# Patient Record
Sex: Male | Born: 1960 | Race: White | Hispanic: No | Marital: Married | Smoking: Former smoker
Health system: Southern US, Community
[De-identification: ages and names within clinical notes are randomized; demographics above are authoritative.]

## PROBLEM LIST (undated history)

## (undated) HISTORY — PX: ANKLE SURGERY: SHX546

## (undated) HISTORY — PX: KIDNEY STONE SURGERY: SHX686

---

## 2001-03-03 HISTORY — PX: TOTAL KNEE ARTHROPLASTY: SHX125

## 2008-02-08 ENCOUNTER — Ambulatory Visit (HOSPITAL_COMMUNITY): Admission: RE | Admit: 2008-02-08 | Discharge: 2008-02-08 | Payer: Self-pay | Admitting: Orthopedic Surgery

## 2009-01-01 ENCOUNTER — Inpatient Hospital Stay (HOSPITAL_COMMUNITY): Admission: RE | Admit: 2009-01-01 | Discharge: 2009-01-04 | Payer: Self-pay | Admitting: Specialist

## 2009-01-07 ENCOUNTER — Inpatient Hospital Stay (HOSPITAL_COMMUNITY): Admission: EM | Admit: 2009-01-07 | Discharge: 2009-01-09 | Payer: Self-pay | Admitting: Emergency Medicine

## 2009-01-08 ENCOUNTER — Ambulatory Visit: Payer: Self-pay | Admitting: Vascular Surgery

## 2009-01-08 ENCOUNTER — Encounter (INDEPENDENT_AMBULATORY_CARE_PROVIDER_SITE_OTHER): Payer: Self-pay | Admitting: Specialist

## 2009-04-03 ENCOUNTER — Ambulatory Visit (HOSPITAL_BASED_OUTPATIENT_CLINIC_OR_DEPARTMENT_OTHER): Admission: RE | Admit: 2009-04-03 | Discharge: 2009-04-03 | Payer: Self-pay | Admitting: Specialist

## 2009-12-14 ENCOUNTER — Encounter: Admission: RE | Admit: 2009-12-14 | Discharge: 2009-12-14 | Payer: Self-pay | Admitting: Orthopedic Surgery

## 2010-04-04 IMAGING — CR DG KNEE 1-2V*L*
2 series · 2 of 2 positions shown · non-contrast
Comparison: None

CLINICAL DATA: Preop for left total knee

LEFT KNEE - 1-2 VIEW

[w knee ap left *]
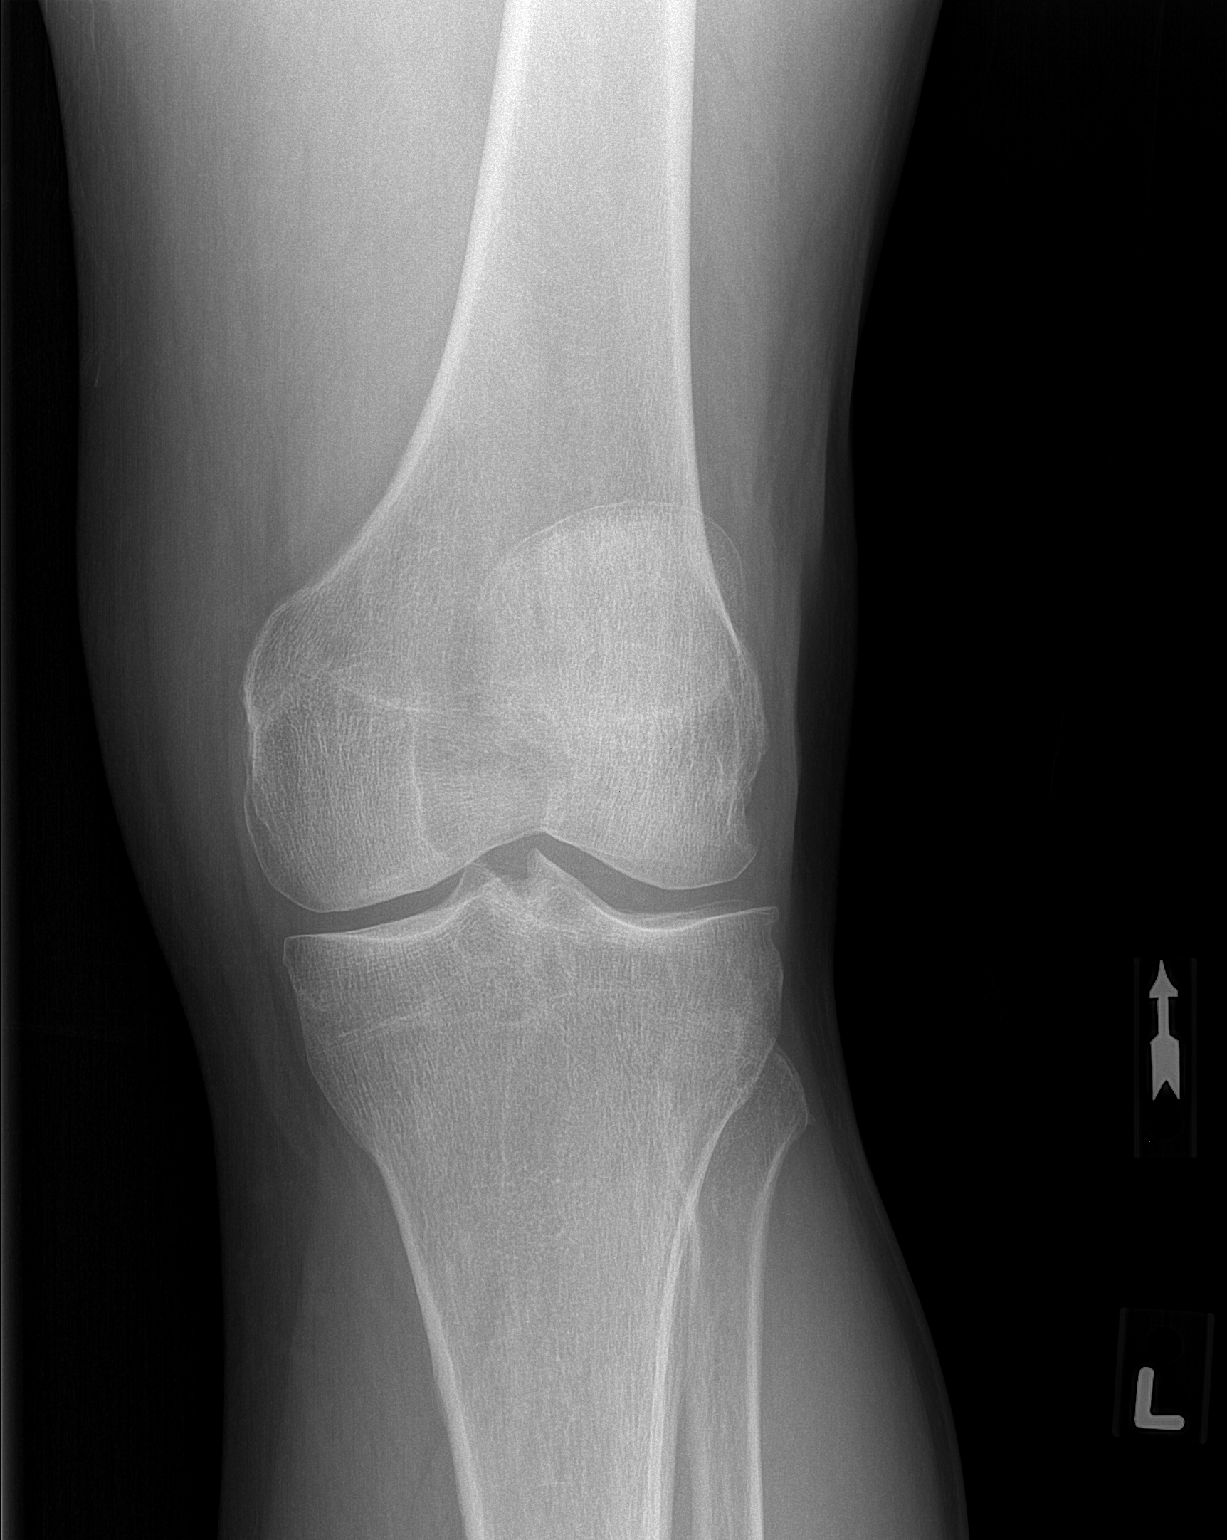

[w knee lat. left]
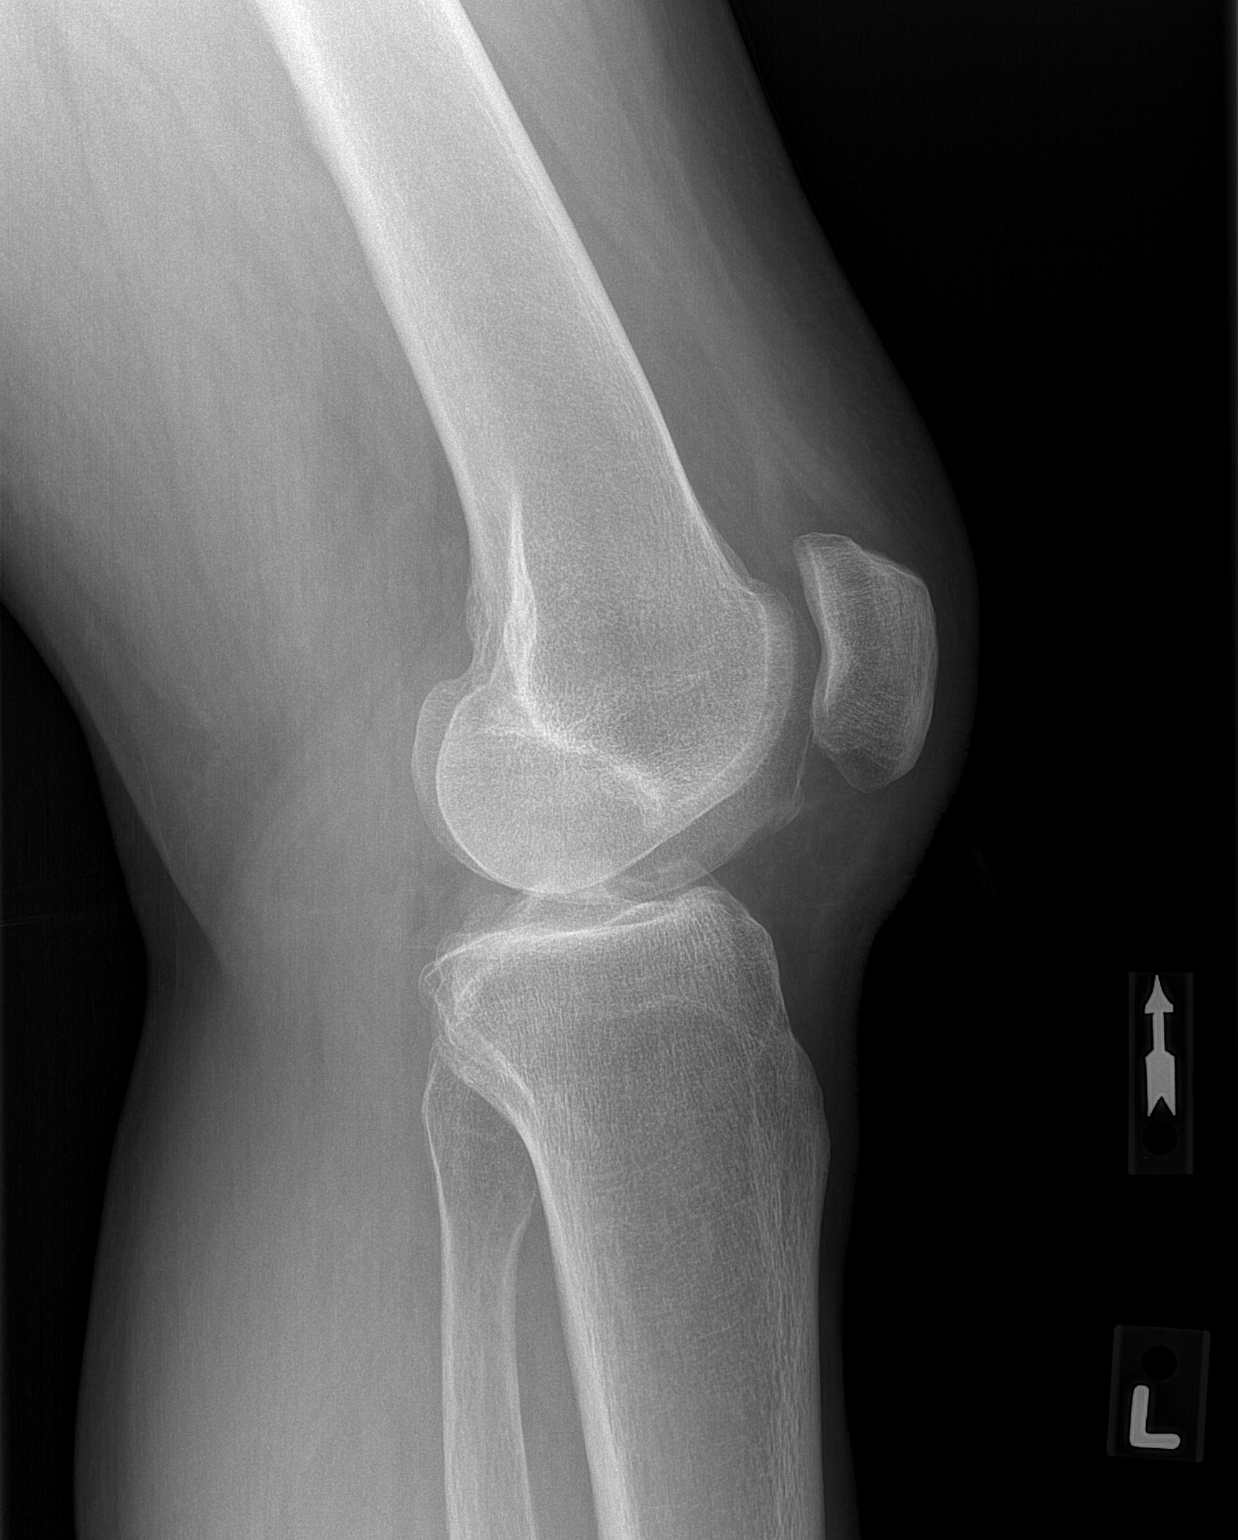

[2 of 2 positions shown; findings below may reference images not displayed]

FINDINGS: There is mild narrowing of the medial and lateral
compartment joint spaces.  There is mild osteophytosis of the
lateral compartment.  No patellar spurring.  No joint effusion.
IMPRESSION: Mild osteoarthritis.

## 2010-05-22 LAB — POCT I-STAT 4, (NA,K, GLUC, HGB,HCT)
Glucose, Bld: 97 mg/dL (ref 70–99)
HCT: 49 % (ref 39.0–52.0)
Hemoglobin: 16.7 g/dL (ref 13.0–17.0)
Potassium: 4 mEq/L (ref 3.5–5.1)
Sodium: 142 mEq/L (ref 135–145)

## 2010-06-05 LAB — CBC
HCT: 27.8 % — ABNORMAL LOW (ref 39.0–52.0)
HCT: 29.1 % — ABNORMAL LOW (ref 39.0–52.0)
HCT: 30.2 % — ABNORMAL LOW (ref 39.0–52.0)
HCT: 36.4 % — ABNORMAL LOW (ref 39.0–52.0)
HCT: 36.9 % — ABNORMAL LOW (ref 39.0–52.0)
HCT: 40.1 % (ref 39.0–52.0)
Hemoglobin: 10 g/dL — ABNORMAL LOW (ref 13.0–17.0)
Hemoglobin: 10.4 g/dL — ABNORMAL LOW (ref 13.0–17.0)
Hemoglobin: 12.5 g/dL — ABNORMAL LOW (ref 13.0–17.0)
Hemoglobin: 12.6 g/dL — ABNORMAL LOW (ref 13.0–17.0)
Hemoglobin: 13.5 g/dL (ref 13.0–17.0)
Hemoglobin: 9.7 g/dL — ABNORMAL LOW (ref 13.0–17.0)
MCHC: 33.6 g/dL (ref 30.0–36.0)
MCHC: 34.2 g/dL (ref 30.0–36.0)
MCHC: 34.4 g/dL (ref 30.0–36.0)
MCHC: 34.4 g/dL (ref 30.0–36.0)
MCHC: 34.9 g/dL (ref 30.0–36.0)
MCV: 94.4 fL (ref 78.0–100.0)
MCV: 95 fL (ref 78.0–100.0)
MCV: 95.1 fL (ref 78.0–100.0)
MCV: 95.2 fL (ref 78.0–100.0)
MCV: 95.8 fL (ref 78.0–100.0)
MCV: 96.1 fL (ref 78.0–100.0)
Platelets: 182 10*3/uL (ref 150–400)
Platelets: 204 10*3/uL (ref 150–400)
Platelets: 231 10*3/uL (ref 150–400)
Platelets: 349 10*3/uL (ref 150–400)
Platelets: 411 10*3/uL — ABNORMAL HIGH (ref 150–400)
Platelets: 543 10*3/uL — ABNORMAL HIGH (ref 150–400)
RBC: 2.94 MIL/uL — ABNORMAL LOW (ref 4.22–5.81)
RBC: 3.07 MIL/uL — ABNORMAL LOW (ref 4.22–5.81)
RBC: 3.17 MIL/uL — ABNORMAL LOW (ref 4.22–5.81)
RBC: 3.83 MIL/uL — ABNORMAL LOW (ref 4.22–5.81)
RBC: 3.85 MIL/uL — ABNORMAL LOW (ref 4.22–5.81)
RBC: 4.17 MIL/uL — ABNORMAL LOW (ref 4.22–5.81)
RDW: 12.4 % (ref 11.5–15.5)
RDW: 12.6 % (ref 11.5–15.5)
RDW: 12.6 % (ref 11.5–15.5)
RDW: 12.6 % (ref 11.5–15.5)
RDW: 12.8 % (ref 11.5–15.5)
WBC: 11.6 10*3/uL — ABNORMAL HIGH (ref 4.0–10.5)
WBC: 13.1 10*3/uL — ABNORMAL HIGH (ref 4.0–10.5)
WBC: 15 10*3/uL — ABNORMAL HIGH (ref 4.0–10.5)
WBC: 8.2 10*3/uL (ref 4.0–10.5)
WBC: 8.4 10*3/uL (ref 4.0–10.5)
WBC: 8.8 10*3/uL (ref 4.0–10.5)

## 2010-06-05 LAB — URINALYSIS, ROUTINE W REFLEX MICROSCOPIC
Bilirubin Urine: NEGATIVE
Glucose, UA: NEGATIVE mg/dL
Leukocytes, UA: NEGATIVE
Nitrite: NEGATIVE
Protein, ur: NEGATIVE mg/dL
Specific Gravity, Urine: 1.01 (ref 1.005–1.030)
Urobilinogen, UA: 0.2 mg/dL (ref 0.0–1.0)
pH: 7.5 (ref 5.0–8.0)

## 2010-06-05 LAB — HEPARIN LEVEL (UNFRACTIONATED)
Heparin Unfractionated: 0.29 IU/mL — ABNORMAL LOW (ref 0.30–0.70)
Heparin Unfractionated: <0.1 IU/mL — ABNORMAL LOW (ref 0.30–0.70)

## 2010-06-05 LAB — BASIC METABOLIC PANEL
BUN: 6 mg/dL (ref 6–23)
BUN: 8 mg/dL (ref 6–23)
CO2: 25 mEq/L (ref 19–32)
CO2: 33 mEq/L — ABNORMAL HIGH (ref 19–32)
Calcium: 8.6 mg/dL (ref 8.4–10.5)
Calcium: 8.9 mg/dL (ref 8.4–10.5)
Chloride: 102 mEq/L (ref 96–112)
Chloride: 102 mEq/L (ref 96–112)
Creatinine, Ser: 0.9 mg/dL (ref 0.4–1.5)
Creatinine, Ser: 1.04 mg/dL (ref 0.4–1.5)
GFR calc Af Amer: >60 mL/min (ref >60)
GFR calc Af Amer: >60 mL/min (ref >60)
GFR calc non Af Amer: >60 mL/min (ref >60)
GFR calc non Af Amer: >60 mL/min (ref >60)
Glucose, Bld: 120 mg/dL — ABNORMAL HIGH (ref 70–99)
Glucose, Bld: 139 mg/dL — ABNORMAL HIGH (ref 70–99)
Potassium: 3.6 mEq/L (ref 3.5–5.1)
Potassium: 4.6 mEq/L (ref 3.5–5.1)
Sodium: 133 mEq/L — ABNORMAL LOW (ref 135–145)
Sodium: 138 mEq/L (ref 135–145)

## 2010-06-05 LAB — COMPREHENSIVE METABOLIC PANEL
ALT: 68 U/L — ABNORMAL HIGH (ref 0–53)
AST: 75 U/L — ABNORMAL HIGH (ref 0–37)
Albumin: 2.6 g/dL — ABNORMAL LOW (ref 3.5–5.2)
Alkaline Phosphatase: 83 U/L (ref 39–117)
BUN: 11 mg/dL (ref 6–23)
CO2: 28 mEq/L (ref 19–32)
Calcium: 8.8 mg/dL (ref 8.4–10.5)
Chloride: 96 mEq/L (ref 96–112)
Creatinine, Ser: 0.96 mg/dL (ref 0.4–1.5)
GFR calc Af Amer: >60 mL/min (ref >60)
GFR calc non Af Amer: >60 mL/min (ref >60)
Glucose, Bld: 120 mg/dL — ABNORMAL HIGH (ref 70–99)
Potassium: 3.1 mEq/L — ABNORMAL LOW (ref 3.5–5.1)
Sodium: 134 mEq/L — ABNORMAL LOW (ref 135–145)
Total Bilirubin: 1.5 mg/dL — ABNORMAL HIGH (ref 0.3–1.2)
Total Protein: 6.8 g/dL (ref 6.0–8.3)

## 2010-06-05 LAB — PROTIME-INR
INR: 1.12 (ref 0.00–1.49)
INR: 1.13 (ref 0.00–1.49)
Prothrombin Time: 14.3 seconds (ref 11.6–15.2)
Prothrombin Time: 14.4 seconds (ref 11.6–15.2)

## 2010-06-05 LAB — URINE CULTURE
Colony Count: NO GROWTH
Culture: NO GROWTH
Special Requests: NEGATIVE

## 2010-06-05 LAB — ABO/RH: ABO/RH(D): A POS

## 2010-06-05 LAB — CROSSMATCH
ABO/RH(D): A POS
Antibody Screen: NEGATIVE

## 2010-06-05 LAB — URINE MICROSCOPIC-ADD ON

## 2010-06-05 LAB — APTT: aPTT: 34 seconds (ref 24–37)

## 2010-06-06 LAB — URINALYSIS, ROUTINE W REFLEX MICROSCOPIC
Bilirubin Urine: NEGATIVE
Glucose, UA: NEGATIVE mg/dL
Ketones, ur: NEGATIVE mg/dL
Nitrite: NEGATIVE
Protein, ur: NEGATIVE mg/dL
pH: 5 (ref 5.0–8.0)

## 2010-06-06 LAB — DIFFERENTIAL
Basophils Absolute: 0.1 10*3/uL (ref 0.0–0.1)
Basophils Relative: 1 % (ref 0–1)
Lymphocytes Relative: 34 % (ref 12–46)
Neutro Abs: 2.9 10*3/uL (ref 1.7–7.7)

## 2010-06-06 LAB — CBC
HCT: 46.8 % (ref 39.0–52.0)
Hemoglobin: 15.9 g/dL (ref 13.0–17.0)
MCV: 95.4 fL (ref 78.0–100.0)
RBC: 4.91 MIL/uL (ref 4.22–5.81)
WBC: 5.6 10*3/uL (ref 4.0–10.5)

## 2010-06-06 LAB — APTT: aPTT: 27 seconds (ref 24–37)

## 2010-06-06 LAB — PROTIME-INR: Prothrombin Time: 11.6 seconds (ref 11.6–15.2)

## 2010-06-06 LAB — COMPREHENSIVE METABOLIC PANEL
Alkaline Phosphatase: 46 U/L (ref 39–117)
BUN: 12 mg/dL (ref 6–23)
CO2: 29 mEq/L (ref 19–32)
Chloride: 106 mEq/L (ref 96–112)
Creatinine, Ser: 0.99 mg/dL (ref 0.4–1.5)
GFR calc non Af Amer: >60 mL/min (ref >60)
Glucose, Bld: 99 mg/dL (ref 70–99)
Potassium: 4.4 mEq/L (ref 3.5–5.1)
Total Bilirubin: 0.8 mg/dL (ref 0.3–1.2)

## 2010-07-16 NOTE — Op Note (Signed)
Derek Francis, Derek Francis               ACCOUNT NO.:  [id number]   MEDICAL RECORD NO.:  [id number]          PATIENT TYPE:  AMB   LOCATION:  SDS                          FACILITY:  [facility]   PHYSICIAN:  Feliberto Gottron. Turner Daniels, M.D.   DATE OF BIRTH:  1960/04/14   DATE OF PROCEDURE:  02/08/2008  DATE OF DISCHARGE:  02/08/2008                               OPERATIVE REPORT   PREOPERATIVE DIAGNOSIS:  Chondromalacia of the left knee.   POSTOPERATIVE DIAGNOSES:  1. Chondromalacia patella grade 3, focal grade 4; trochlea grade 3,      focal grade 4.  2. Multiple cartilaginous loose bodies and scar tissue and fibrous      band that adhered the anteromedial aspect of the patella tightly to      the synovium causing an overpressure phenomena into the anterior      flange of the medial femoral condyle.   PROCEDURE:  Arthroscopic removal of scar tissue/fibrous band, multiple  cartilaginous loose bodies and debridement of chondromalacia.   SURGEON:  Feliberto Gottron. Turner Daniels, MD   FIRST ASSISTANT:  None.   ANESTHESIA:  General LMA plus local Marcaine with epinephrine solution.   ESTIMATED BLOOD LOSS:  Minimal.   FLUID REPLACEMENT:  600 mL of crystalloid.   DRAINS PLACED:  None.   TOURNIQUET TIME:  None.   INDICATIONS FOR PROCEDURE:  The patient is a 50 year old man who was  hurt at work, has undergone considerable treatment with anti-  inflammatory medicines, physical therapy.  MRI scan that was  nondiagnostic.  I saw him as a second opinion, and he had had a good  temporary response to a couple of cortisone injections.  Because of  this, he was felt to be a candidate for arthroscopic evaluation and  treatment of his left knee despite the MRI scans that did not show much  of any pathology.  Risks and benefits of surgery were discussed,  questions answered.   DESCRIPTION OF PROCEDURE:  The patient identified by armband and taken  to the operating room at the [facility] where the surgery was done  because  of a history of sleep apnea.  The appropriate anesthetic  monitors were attached and general LMA anesthesia induced with the  patient in supine position.  Lateral post applied to the table, and left  lower extremity prepped and draped in the usual sterile fashion from the  ankle to the mid thigh.  The inferomedial and inferolateral peripatellar  regions infiltrated with 2-3 mL of 0.5% Marcaine and epinephrine  solution and another 12 mL placed into the joint itself.  Using an #11  blade, standard inferomedial and inferolateral peripatellar portals were  made allowing introduction of the arthroscope through the inferolateral  portal and the outflow through the inferomedial portal.  Diagnostic  arthroscopy revealed grade 3 chondromalacia of the patella, focal grade  4 and this was debrided back to a stable margin along the apex.  The  trochlea had similar changes, mostly grade 3 and small areas of focal  grade 4, primarily along the anterior flange where the patient had a  tight  band of fibrous tissues/scar tissue probably from his initial  injury.  This fibrous band was resected freeing up the patella and  taking all of the pressure off the anterior flange of the femur.  Photographic documentation was made of the chondromalacia.  Multiple  cartilaginous loose bodies were also resected.  Moving into the medial  compartment, the articular and meniscal cartilages of the medial side of  the knee were in good condition except for a small stripe on the distal  aspect of the medial femoral condyle, grade 3 chondromalacia.  The ACL  and PCL were intact.  On the lateral side, the articular cartilage was  intact.  The patient had degenerative tearing of the lateral horn of the  lateral meniscus and this was debrided back to a stable margin as well.  The gutters were cleared medially and laterally removing some more small  cartilaginous loose bodies.  The knee was irrigated out with normal  saline  solution.  The arthroscopic instruments were removed.  A dressing  of Xeroform, 4 x 4 dressing sponges, Webril, and an Ace wrap applied.  The patient was then awakened and taken to the recovery room without  difficulty.      Feliberto Gottron. Turner Daniels, M.D.  Electronically Signed     FJR/MEDQ  D:  02/08/2008  T:  02/09/2008  Job:  [id number]

## 2010-11-18 ENCOUNTER — Ambulatory Visit: Payer: Worker's Compensation | Admitting: Physical Medicine & Rehabilitation

## 2010-11-18 ENCOUNTER — Encounter: Payer: Worker's Compensation | Attending: Physical Medicine & Rehabilitation

## 2010-11-18 DIAGNOSIS — G8928 Other chronic postprocedural pain: Secondary | ICD-10-CM

## 2010-11-20 NOTE — Consult Note (Addendum)
  ADDENDUM:  We discussed treatment recommendations. 1. I agree with his current pain management per Dr. Ethelene Hal. 2. He may benefit from a chronic multidisciplinary pain program     evaluation.  I would recommend that he goes to [facility] for     this. 3. Acupuncture as an adjunct treatment, but not as a stand alone     treatment maybe helpful in this situation. 4. Discussed that this is a second opinion only no assumption of care.  Questions answered.     Erick Colace, M.D.    AEK/MedQ D:11/18/2010 15:16:42  T:11/18/2010 21:17:25  Job #:  [id number]  Electronically Signed by Claudette Laws M.D. on 11/20/2010 10:21:43 AM ADDENDUM:  We discussed treatment recommendations. 1. I agree with his current pain management per Dr. Ethelene Hal. 2. He may benefit from a chronic multidisciplinary pain program     evaluation.  I would recommend that he goes to [facility] for     this. 3. Acupuncture as an adjunct treatment, but not as a stand alone     treatment maybe helpful in this situation. 4. Discussed that this is a second opinion only no assumption of care.  Questions answered.     Erick Colace, M.D. Electronically Signed    AEK/MedQ D:11/18/2010 15:16:42  T:11/18/2010 21:17:25  Job #:  [id number]

## 2010-11-20 NOTE — Consult Note (Addendum)
REASON FOR CONSULTATION:  Second opinion on only pain management.  Records reviewed include records from [facility] from [employer] for [facility] from [facility].  HISTORY OF PRESENT ILLNESS:  A 50 year old male who tripped landing on his left knee at work on August 11, 2007.  He was initially seen at [facility] and diagnosed with a left knee sprain.  His knee x-ray on that date was negative.  He complained of further pain. He had left knee MRI on August 27, 2007, showing questionable bone contusion medial patellar pole.  There was some postop changes from prior meniscal repair.  He does have a history of arthroscopic surgery on the left knee that preceded his work injury.  He had tried some injections without much help.  The patient was also seen by Dr. Turner Daniels, from [facility] who did arthroscopy on him.  Dr. Turner Daniels diagnosed chondromalacia of the left knee.  His procedure was February 08, 2008.  Dr. Hayden Rasmussen developed left knee arthroplasty as well as diagnostic arthroscopy January 01, 2009, found advanced tricompartmental osteoarthritis.  The patient had left knee manipulation under anesthesia for arthrofibrosis on April 03, 2009 per Dr. Hayden Rasmussen.  The patient was evaluated by Dr. Danne Harbor [facility].  He has been trialed on several pain medications including OxyContin CR on a b.i.d. basis as well as OxyIR on t.i.d. basis.  He has been monitored with urine drug screens which have been consistent at least the reports that I saw.  He has been seen by Dr. Iona Coach from Pain [facility].  He was diagnosed with chronic pain syndrome as well as adjustment disorder.  He also has been seen by Dr. Andrey Campanile at [facility], second opinion question any type of complication from the knee.  There was a nuclear medicine bone scan done this year which brought up question loosening.  REVIEW OF SYSTEMS:   Problem walking, depression, anxiety, shortness of breath, sleep apnea, wheezing.  SOCIAL HISTORY:  He states he is receiving disability payments.  He feels like he is stressed out, raising three grand kids.  He has a ninth grade education and is going "school" sent by comp, I am not sure whether this is a GED class or some other vocational rehab.  Family history of alcohol abuse.  His opioid risk tool score is 4, putting at a moderate risk for abuse.  PHYSICAL EXAMINATION:  VITAL SIGNS:  Blood pressure 144/83, pulse 92, respirations 16, and O2 sat 93% on room air. GENERAL:  This is a overweight male in no acute distress.  Orientation x3.  Affect is alert.  Gait is normal. MUSCULOSKELETAL:  His extremities are without edema.  He has minimal effusion left knee compared to the right knee.  He has postoperative scar which appears well healed.  His knee range of motion on the right side uninvolved 110 degrees with flexion -5 extension, left side 95 degrees flexion and -15 extension.  Sensory exam shows decreased sensation in medial knee area.  No dermatomal loss of sensation.  Deep tendon reflexes are normal.  Bilateral upper and lower extremity strength is normal, bilateral upper and lower extremity straight leg raising test is negative.  His back has mild tenderness to palpation in the lumbar paraspinals, lumbar range of motion about 50% forward flexion, extension, lateral rotation and bending.  Gait shows no evidence of toe drag or knee instability.  IMPRESSION: 1. Chronic postoperative pain.  He has had two arthroscopic surgeries  one prior to his work-related injury that he recovered well from,     was able to resume work and another one performed by Dr. Turner Daniels and     then the total knee arthroplasty performed by Dr. Thomasena Edis, and a     manipulation under general anesthesia performed by Dr. Thomasena Edis as     well.  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  I do feel that the pain management that he has     received thus far has been appropriate.  He has been trialed on     short-acting agents as well as a combination short-acting and long-     acting.  As I discussed with patient and his case manager, there is     really no one narcotic analgesic that is really superior to others     and all of them can only provide a partial relief of pain rather     than complete relief of pain. In regards to invasive procedures, I really do not think there is any type of pain blocks that are indicated in this situation. 1. I do not think any further physical therapy would likely be     helpful.  He has gone through several rounds without much effect. 2. What has not been tried is a [employer] pain program and I     think he may benefit from this and would recommend some evaluation     for the [facility] given that this is close to home for     him. 3. We did talk about acupuncture as a potential adjunct procedure,     although certainly would not be a stand alone pain management     procedure and would be used in conjunction with other treatment.  Made clear that this was not an assumption of any type of treatment. The patient and case manager are in agreement.     Erick Colace, M.D.    AEK/MedQ D:11/18/2010 15:15:05  T:11/18/2010 21:25:10  Job #:  [id number]  cc:   Caralyn Guile. Ethelene Hal, M.D. Fax: [phone]  Electronically Signed by Claudette Laws M.D. on 11/20/2010 10:21:45 AM REASON FOR CONSULTATION:  Second opinion on only pain management.  Records reviewed include records from [facility] from [employer] for [facility] from [facility].  HISTORY OF PRESENT ILLNESS:  A 50 year old male who tripped landing on his left knee at work on August 11, 2007.  He was initially seen at [facility] and diagnosed with a left knee sprain.  His knee x-ray on that date was negative.  He complained of further pain. He had left knee MRI on August 27, 2007, showing questionable bone contusion medial patellar pole.  There was some postop changes from prior meniscal repair.  He does have a history of arthroscopic surgery on the left knee that preceded his work injury.  He had tried some injections without much help.  The patient was also seen by Dr. Turner Daniels, from [facility] who did arthroscopy on him.  Dr. Turner Daniels diagnosed chondromalacia of the left knee.  His procedure was February 08, 2008.  Dr. Hayden Rasmussen developed left knee arthroplasty as well as diagnostic arthroscopy January 01, 2009, found advanced tricompartmental osteoarthritis.  The patient had left knee manipulation under anesthesia  for arthrofibrosis on April 03, 2009 per Dr. Hayden Rasmussen.  The patient was evaluated by Dr. Danne Harbor [facility].  He has been trialed on several pain medications including OxyContin CR on a b.i.d. basis as well as OxyIR on t.i.d. basis.  He has been monitored with urine drug screens which have been consistent at least the reports that I saw.  He has been seen by Dr. Rosalin Hawking from [facility].  He was diagnosed with chronic pain syndrome as well as adjustment disorder.  He also has been seen by Dr. Andrey Campanile at [facility], second opinion question any type of complication from the knee.  There was a nuclear medicine bone scan done this year which brought up question loosening.  REVIEW OF SYSTEMS:  Problem walking, depression, anxiety, shortness of breath, sleep apnea, wheezing.  SOCIAL HISTORY:  He states he is receiving disability payments.  He feels like he is stressed out, raising three grand kids.  He has a ninth grade education and is going "school" sent by comp, I am not sure whether this is a GED class or some other vocational rehab.  Family  history of alcohol abuse.  His opioid risk tool score is 4, putting at a moderate risk for abuse.  PHYSICAL EXAMINATION:  VITAL SIGNS:  Blood pressure 144/83, pulse 92, respirations 16, and O2 sat 93% on room air. GENERAL:  This is a overweight male in no acute distress.  Orientation x3.  Affect is alert.  Gait is normal. MUSCULOSKELETAL:  His extremities are without edema.  He has minimal effusion left knee compared to the right knee.  He has postoperative scar which appears well healed.  His knee range of motion on the right side uninvolved 110 degrees with flexion -5 extension, left side 95 degrees flexion and -15 extension.  Sensory exam shows decreased sensation in medial knee area.  No dermatomal loss of sensation.  Deep tendon reflexes are normal.  Bilateral upper and lower extremity strength is normal, bilateral upper and lower extremity straight leg raising test is negative.  His back has mild tenderness to palpation in the lumbar paraspinals, lumbar range of motion about 50% forward flexion, extension, lateral rotation and bending.  Gait shows no evidence of toe drag or knee instability.  IMPRESSION: 1. Chronic postoperative pain.  He has had two arthroscopic surgeries     one prior to his work-related injury that he recovered well from,     was able to resume work and another one performed by Dr. Turner Daniels and     then the total knee arthroplasty performed by Dr. Thomasena Edis, and a     manipulation under general anesthesia performed by Dr. Thomasena Edis as     well.  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  I do feel that the pain management that he has     received thus far has been appropriate.  He has been trialed on     short-acting agents as well as a combination short-acting and long-     acting.  As I discussed with patient and his case manager, there is     really no one  narcotic analgesic that is really superior to others     and all of them can only provide a partial relief of pain rather  than complete relief of pain. In regards to invasive procedures, I really do not think there is any type of pain blocks that are indicated in this situation. 1. I do not think any further physical therapy would likely be     helpful.  He has gone through several rounds without much effect. 2. What has not been tried is a [employer] pain program and I     think he may benefit from this and would recommend some evaluation     for the [facility] given that this is close to home for     him. 3. We did talk about acupuncture as a potential adjunct procedure,     although certainly would not be a stand alone pain management     procedure and would be used in conjunction with other treatment.  Made clear that this was not an assumption of any type of treatment. The patient and case manager are in agreement.     Erick Colace, M.D. Electronically Signed    AEK/MedQ D:11/18/2010 15:15:05  T:11/18/2010 21:25:10  Job #:  [id number]  cc:   Caralyn Guile. Ethelene Hal, M.D. Fax: [phone]

## 2010-12-06 LAB — BASIC METABOLIC PANEL WITH GFR
BUN: 15 mg/dL (ref 6–23)
CO2: 23 meq/L (ref 19–32)
Calcium: 9 mg/dL (ref 8.4–10.5)
Chloride: 107 meq/L (ref 96–112)
Creatinine, Ser: 1.02 mg/dL (ref 0.4–1.5)
GFR calc non Af Amer: >60 mL/min
Glucose, Bld: 104 mg/dL — ABNORMAL HIGH (ref 70–99)
Potassium: 3.8 meq/L (ref 3.5–5.1)
Sodium: 136 meq/L (ref 135–145)

## 2010-12-06 LAB — CBC
HCT: 46.7 % (ref 39.0–52.0)
MCV: 95.6 fL (ref 78.0–100.0)
Platelets: 236 10*3/uL (ref 150–400)
RDW: 12.8 % (ref 11.5–15.5)
WBC: 7.7 10*3/uL (ref 4.0–10.5)

## 2011-03-05 DIAGNOSIS — T8484XA Pain due to internal orthopedic prosthetic devices, implants and grafts, initial encounter: Secondary | ICD-10-CM | POA: Insufficient documentation

## 2011-03-05 HISTORY — DX: Pain due to internal orthopedic prosthetic devices, implants and grafts, initial encounter: T84.84XA

## 2011-03-17 IMAGING — CR DG KNEE 1-2V*L*
2 series · 2 of 2 positions shown · non-contrast
Comparison: April 03, 2009

CLINICAL DATA: Left knee pain; history of joint replacement

LEFT KNEE - 1-2 VIEW

[view not recorded (1 of 2)]
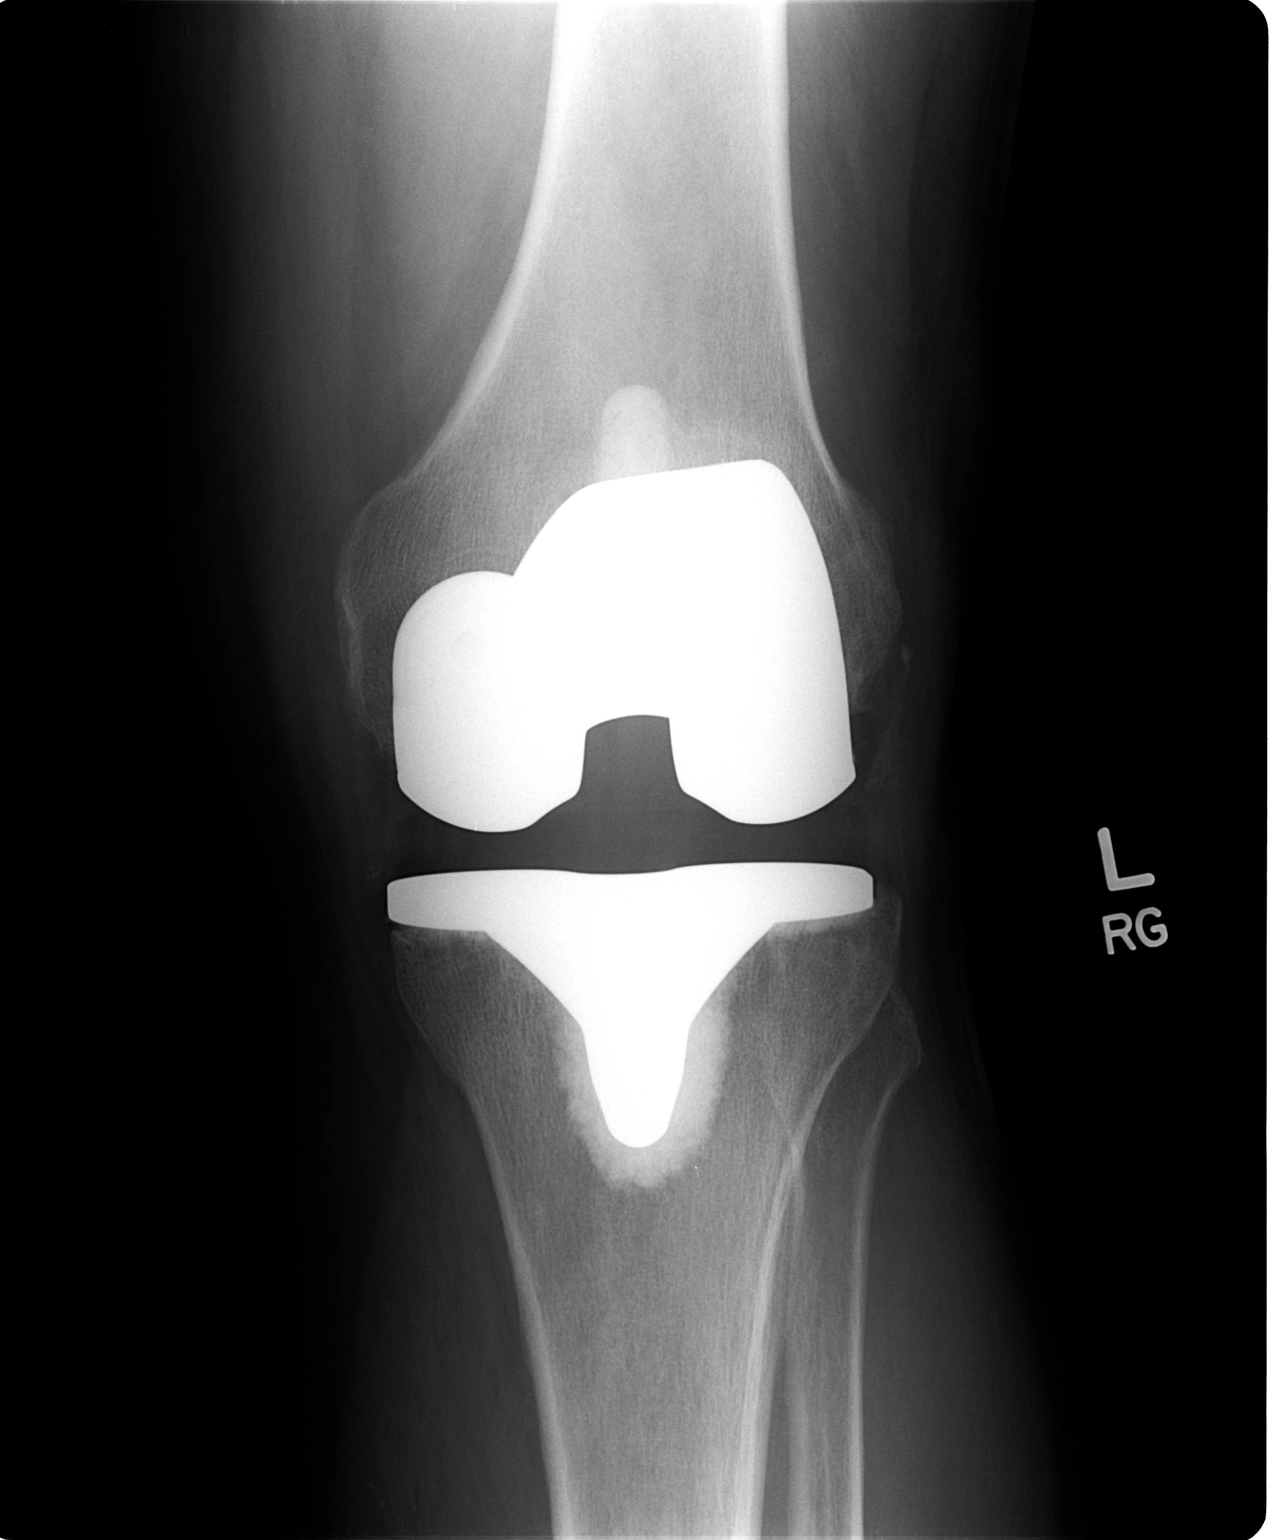

[view not recorded (2 of 2)]
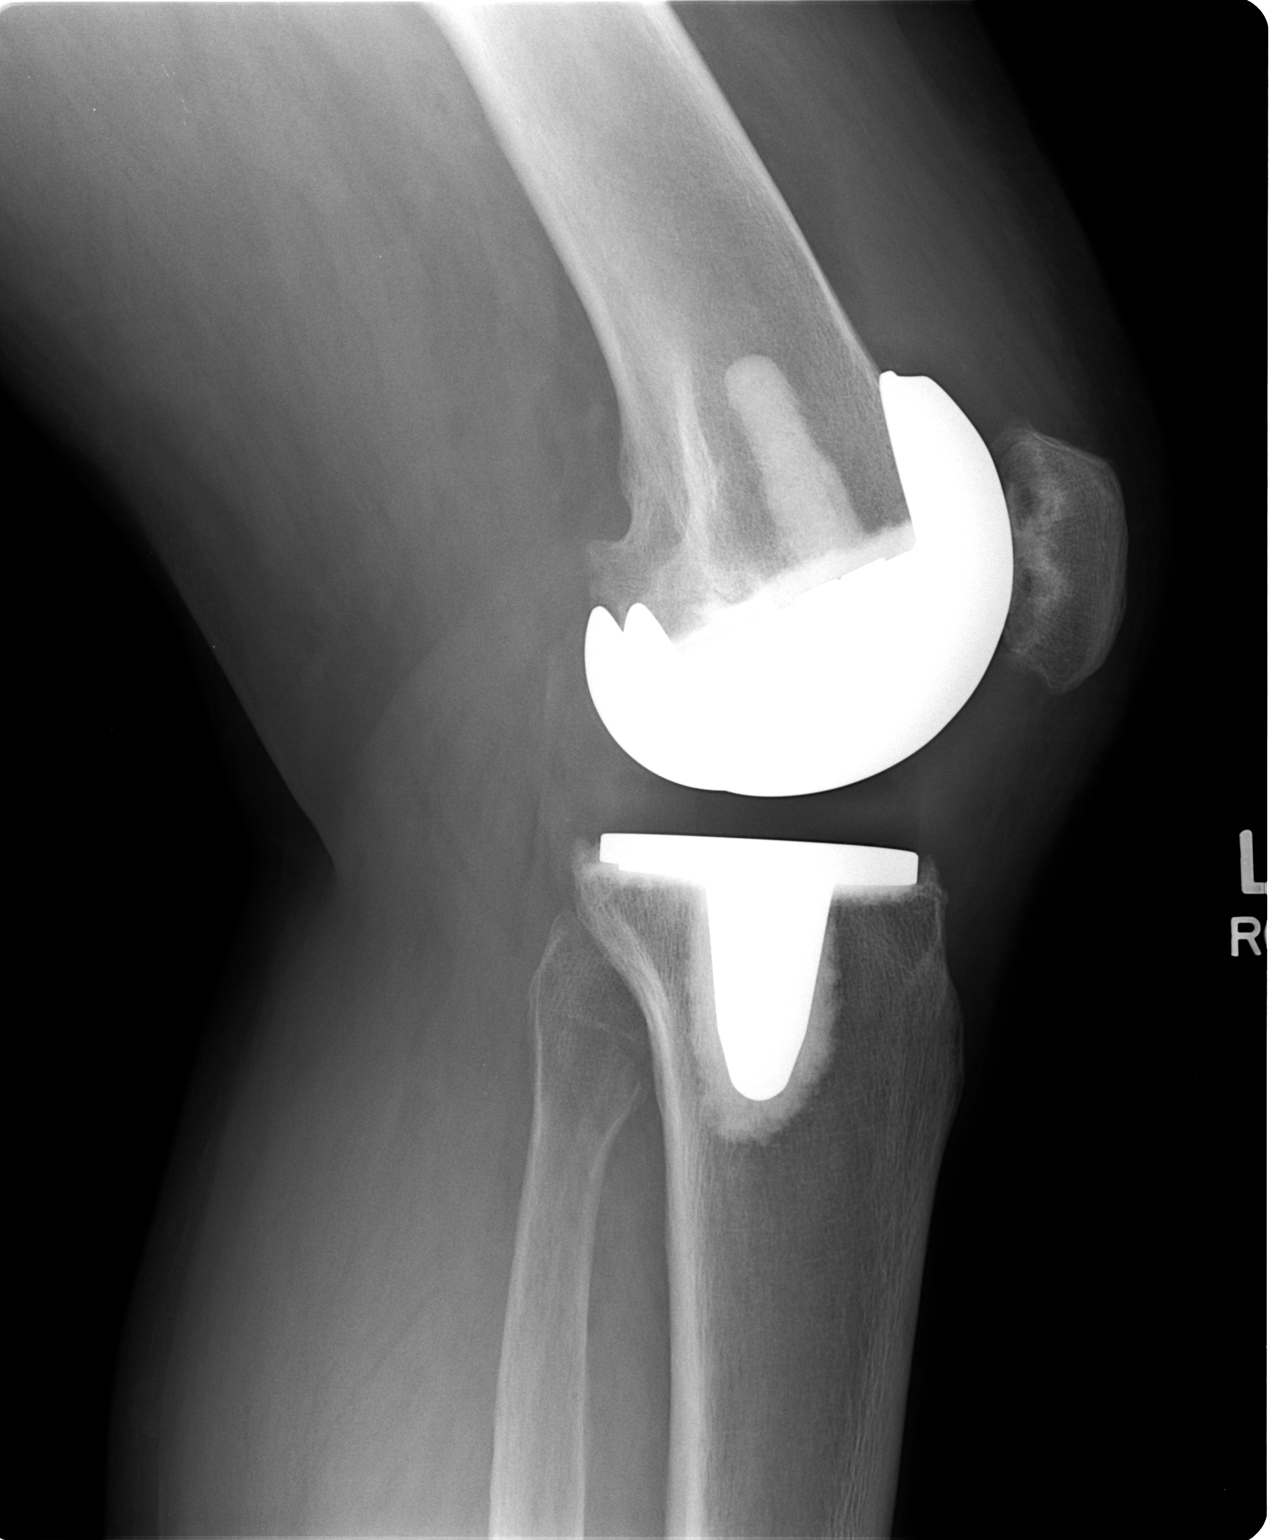

[2 of 2 positions shown; findings below may reference images not displayed]

FINDINGS: The left total knee prosthesis is anatomic in alignment.
There is no peri hardware lucency to suggest loosening or
infection.  No fracture or dislocation are identified.  There is no
evidence of gross joint effusion.
IMPRESSION: Left total knee prosthesis in anatomic alignment with no evidence
of hardware complication.

## 2011-08-11 DIAGNOSIS — E039 Hypothyroidism, unspecified: Secondary | ICD-10-CM

## 2011-08-11 HISTORY — DX: Hypothyroidism, unspecified: E03.9

## 2011-09-03 DIAGNOSIS — E291 Testicular hypofunction: Secondary | ICD-10-CM

## 2011-09-03 HISTORY — DX: Testicular hypofunction: E29.1

## 2011-10-03 DIAGNOSIS — G4733 Obstructive sleep apnea (adult) (pediatric): Secondary | ICD-10-CM | POA: Insufficient documentation

## 2011-10-03 HISTORY — DX: Obstructive sleep apnea (adult) (pediatric): G47.33

## 2011-12-05 DIAGNOSIS — N529 Male erectile dysfunction, unspecified: Secondary | ICD-10-CM | POA: Insufficient documentation

## 2011-12-05 HISTORY — DX: Male erectile dysfunction, unspecified: N52.9

## 2012-03-10 DIAGNOSIS — M25569 Pain in unspecified knee: Secondary | ICD-10-CM | POA: Insufficient documentation

## 2012-03-10 DIAGNOSIS — Z96659 Presence of unspecified artificial knee joint: Secondary | ICD-10-CM | POA: Insufficient documentation

## 2012-09-07 DIAGNOSIS — Z9889 Other specified postprocedural states: Secondary | ICD-10-CM | POA: Insufficient documentation

## 2012-09-07 DIAGNOSIS — L905 Scar conditions and fibrosis of skin: Secondary | ICD-10-CM | POA: Insufficient documentation

## 2012-12-09 DIAGNOSIS — E1165 Type 2 diabetes mellitus with hyperglycemia: Secondary | ICD-10-CM | POA: Insufficient documentation

## 2013-09-01 DIAGNOSIS — Z9049 Acquired absence of other specified parts of digestive tract: Secondary | ICD-10-CM | POA: Insufficient documentation

## 2013-09-01 HISTORY — PX: CHOLECYSTECTOMY: SHX55

## 2014-09-06 DIAGNOSIS — D125 Benign neoplasm of sigmoid colon: Secondary | ICD-10-CM | POA: Insufficient documentation

## 2014-09-06 HISTORY — DX: Benign neoplasm of sigmoid colon: D12.5

## 2017-01-27 DIAGNOSIS — E66812 Obesity, class 2: Secondary | ICD-10-CM

## 2017-01-27 DIAGNOSIS — R079 Chest pain, unspecified: Secondary | ICD-10-CM

## 2017-01-27 HISTORY — DX: Morbid (severe) obesity due to excess calories: E66.01

## 2017-01-27 HISTORY — DX: Obesity, class 2: E66.812

## 2017-01-27 HISTORY — DX: Chest pain, unspecified: R07.9

## 2018-12-29 DIAGNOSIS — N50811 Right testicular pain: Secondary | ICD-10-CM

## 2018-12-29 DIAGNOSIS — N50812 Left testicular pain: Secondary | ICD-10-CM | POA: Insufficient documentation

## 2018-12-29 HISTORY — DX: Right testicular pain: N50.811

## 2018-12-31 DIAGNOSIS — N486 Induration penis plastica: Secondary | ICD-10-CM

## 2018-12-31 DIAGNOSIS — R351 Nocturia: Secondary | ICD-10-CM | POA: Insufficient documentation

## 2018-12-31 HISTORY — DX: Nocturia: R35.1

## 2018-12-31 HISTORY — DX: Induration penis plastica: N48.6

## 2019-02-02 DIAGNOSIS — U071 COVID-19: Secondary | ICD-10-CM | POA: Insufficient documentation

## 2019-07-07 DIAGNOSIS — K219 Gastro-esophageal reflux disease without esophagitis: Secondary | ICD-10-CM

## 2019-07-07 DIAGNOSIS — M6208 Separation of muscle (nontraumatic), other site: Secondary | ICD-10-CM | POA: Insufficient documentation

## 2019-07-07 DIAGNOSIS — T402X5A Adverse effect of other opioids, initial encounter: Secondary | ICD-10-CM | POA: Insufficient documentation

## 2019-07-07 HISTORY — DX: Separation of muscle (nontraumatic), other site: M62.08

## 2019-07-07 HISTORY — DX: Gastro-esophageal reflux disease without esophagitis: K21.9

## 2020-09-04 DIAGNOSIS — R7989 Other specified abnormal findings of blood chemistry: Secondary | ICD-10-CM | POA: Insufficient documentation

## 2020-09-04 DIAGNOSIS — G8929 Other chronic pain: Secondary | ICD-10-CM

## 2020-09-04 HISTORY — DX: Other specified abnormal findings of blood chemistry: R79.89

## 2020-09-04 HISTORY — DX: Other chronic pain: G89.29

## 2021-12-19 DIAGNOSIS — R001 Bradycardia, unspecified: Secondary | ICD-10-CM | POA: Diagnosis not present

## 2021-12-19 DIAGNOSIS — E785 Hyperlipidemia, unspecified: Secondary | ICD-10-CM | POA: Diagnosis not present

## 2021-12-19 DIAGNOSIS — Z8249 Family history of ischemic heart disease and other diseases of the circulatory system: Secondary | ICD-10-CM | POA: Diagnosis not present

## 2021-12-19 DIAGNOSIS — R079 Chest pain, unspecified: Secondary | ICD-10-CM | POA: Diagnosis not present

## 2021-12-19 DIAGNOSIS — I10 Essential (primary) hypertension: Secondary | ICD-10-CM

## 2021-12-19 DIAGNOSIS — E669 Obesity, unspecified: Secondary | ICD-10-CM | POA: Diagnosis not present

## 2021-12-20 ENCOUNTER — Encounter (HOSPITAL_COMMUNITY): Payer: Self-pay

## 2021-12-20 DIAGNOSIS — R079 Chest pain, unspecified: Secondary | ICD-10-CM

## 2021-12-20 DIAGNOSIS — E669 Obesity, unspecified: Secondary | ICD-10-CM

## 2021-12-20 DIAGNOSIS — I10 Essential (primary) hypertension: Secondary | ICD-10-CM

## 2021-12-20 DIAGNOSIS — E785 Hyperlipidemia, unspecified: Secondary | ICD-10-CM

## 2021-12-20 DIAGNOSIS — I200 Unstable angina: Secondary | ICD-10-CM

## 2021-12-21 ENCOUNTER — Inpatient Hospital Stay (HOSPITAL_COMMUNITY)
Admission: RE | Admit: 2021-12-21 | Discharge: 2021-12-24 | DRG: 287 | Disposition: A | Discharge status: Home / Self Care | Payer: Medicare (Managed Care) | Attending: Cardiology | Admitting: Cardiology

## 2021-12-21 DIAGNOSIS — R001 Bradycardia, unspecified: Secondary | ICD-10-CM | POA: Diagnosis present

## 2021-12-21 DIAGNOSIS — E119 Type 2 diabetes mellitus without complications: Secondary | ICD-10-CM

## 2021-12-21 DIAGNOSIS — R9439 Abnormal result of other cardiovascular function study: Secondary | ICD-10-CM | POA: Diagnosis present

## 2021-12-21 DIAGNOSIS — I2510 Atherosclerotic heart disease of native coronary artery without angina pectoris: Secondary | ICD-10-CM

## 2021-12-21 DIAGNOSIS — Z6837 Body mass index (BMI) 37.0-37.9, adult: Secondary | ICD-10-CM | POA: Diagnosis not present

## 2021-12-21 DIAGNOSIS — E669 Obesity, unspecified: Secondary | ICD-10-CM | POA: Diagnosis present

## 2021-12-21 DIAGNOSIS — R079 Chest pain, unspecified: Secondary | ICD-10-CM | POA: Diagnosis present

## 2021-12-21 DIAGNOSIS — Z79899 Other long term (current) drug therapy: Secondary | ICD-10-CM | POA: Diagnosis not present

## 2021-12-21 DIAGNOSIS — I259 Chronic ischemic heart disease, unspecified: Secondary | ICD-10-CM | POA: Diagnosis present

## 2021-12-21 DIAGNOSIS — E785 Hyperlipidemia, unspecified: Secondary | ICD-10-CM | POA: Diagnosis present

## 2021-12-21 DIAGNOSIS — K219 Gastro-esophageal reflux disease without esophagitis: Secondary | ICD-10-CM | POA: Diagnosis present

## 2021-12-21 DIAGNOSIS — E782 Mixed hyperlipidemia: Secondary | ICD-10-CM

## 2021-12-21 DIAGNOSIS — E118 Type 2 diabetes mellitus with unspecified complications: Secondary | ICD-10-CM | POA: Diagnosis present

## 2021-12-21 DIAGNOSIS — E890 Postprocedural hypothyroidism: Secondary | ICD-10-CM | POA: Diagnosis present

## 2021-12-21 DIAGNOSIS — R0789 Other chest pain: Secondary | ICD-10-CM | POA: Diagnosis not present

## 2021-12-21 DIAGNOSIS — G8929 Other chronic pain: Secondary | ICD-10-CM | POA: Diagnosis present

## 2021-12-21 DIAGNOSIS — Z8249 Family history of ischemic heart disease and other diseases of the circulatory system: Secondary | ICD-10-CM | POA: Diagnosis not present

## 2021-12-21 DIAGNOSIS — R931 Abnormal findings on diagnostic imaging of heart and coronary circulation: Secondary | ICD-10-CM

## 2021-12-21 DIAGNOSIS — I10 Essential (primary) hypertension: Secondary | ICD-10-CM | POA: Diagnosis present

## 2021-12-21 DIAGNOSIS — G4733 Obstructive sleep apnea (adult) (pediatric): Secondary | ICD-10-CM | POA: Diagnosis present

## 2021-12-21 DIAGNOSIS — Z79890 Hormone replacement therapy (postmenopausal): Secondary | ICD-10-CM

## 2021-12-21 MED ORDER — OXYCODONE HCL 5 MG PO TABS
15.0000 mg | ORAL_TABLET | Freq: Once | ORAL | Status: AC
  Administered 2021-12-21: 15 mg via ORAL
  Filled 2021-12-21: qty 3

## 2021-12-21 NOTE — Progress Notes (Addendum)
Received from [place] via [facility]. Oriented to room/unit. Call bell in reach. Denies CP, SOB or other c/o at this time. Dr Alfred Levins paged for orders. Order received for diet. Menu given. Pt instructed how to call to order from dietary. Also, pharmacy called to verify pt's home meds.

## 2021-12-21 NOTE — H&P (Signed)
Cardiology Admission History and Physical   Patient ID: Derek Francis MRN: [id number]; DOB: 1960/05/17   Admission date: 12/21/2021  PCP:  Norberto Sorenson, PA-C   [place] Providers Cardiologist:  None        Chief Complaint:  chest pain  Patient Profile:   Derek Francis is a 61 y.o. male with a PMHx of HTN, IBS/GERD, T2DM, obesity, hypothyroidism/Graves' disease, chronic pain, hyperlipidemia, and OSA who is being seen 12/21/2021 for the evaluation of abnormal stress test.  History of Present Illness:   Mr. Derek Francis reports one episode of left chest tightness/sharp pain (8/10 on severity) this past Tuesday while he was sleeping. The pain radiated to his left shoulder.  He also reports associated symptoms including nausea and shortness of breath.The pain resolved with SL nitroglycerin.  He had another similar episode this past Wednesday and he presented at [facility] 10/18.  He is bothered recently by the death of his nephew who died suddenly of a heart attack. At OSH, his troponinx2 were negative with positive D-dimer(CT findings for pulmonary embolism).  EKG reviewed sinus bradycardia.  His Lovenox was discontinued since he was chest pain since admission.  TTE showed LVEF 60 to 65% without significant valvular stenosis or regurgitation.  His MPS reported moderate reversible defect in the mid and the distal lateral and inferior worse, consistent with inducible myocardial ischemia.  He was transferred here for further evaluation.  Continued his chest pain-free, on room air, no acute distress.    He states his last heart catheterization was about 2 years ago which was fairly unremarkable at a [facility].  He had another unremarkable heart catheterization approximately 7 years ago  Denies tobacco use.  Reports family history of coronary artery disease.  Medications Prior to Admission: Prior to Admission medications   Not on File     Allergies:     Allergies  Allergen Reactions   Reglan [Metoclopramide] Anxiety   Toradol [Ketorolac Tromethamine] Anxiety    Social History:   Social History   Socioeconomic History   Marital status: Married    Spouse name: Not on file   Number of children: Not on file   Years of education: Not on file   Highest education level: Not on file  Occupational History   Not on file  Tobacco Use   Smoking status: Not on file   Smokeless tobacco: Not on file  Substance and Sexual Activity   Alcohol use: Not on file   Drug use: Not on file   Sexual activity: Not on file  Other Topics Concern   Not on file  Social History Narrative   Not on file   Social Determinants of Health   Financial Resource Strain: Not on file  Food Insecurity: Not on file  Transportation Needs: Not on file  Physical Activity: Not on file  Stress: Not on file  Social Connections: Not on file  Intimate Partner Violence: Not on file    Family History:   The patient's family history is not on file.    ROS:  Please see the history of present illness.  All other ROS reviewed and negative.     Physical Exam/Data:   Vitals:   12/21/21 1725  BP: 129/87  Pulse: 67  Resp: 18  Temp: 98.3 F (36.8 C)  TempSrc: Oral  SpO2: 94%  Weight: 131.1 kg  Height: '6\' 2"'$  (1.88 m)   No intake or output data in the 24 hours  ending 12/21/21 2045    12/21/2021    5:25 PM  Last 3 Weights  Weight (lbs) 289 lb  Weight (kg) 131.09 kg     Body mass index is 37.11 kg/m.  General:  Well nourished, well developed, in no acute distress HEENT: normal Neck: no JVD Vascular: No carotid bruits; Distal pulses 2+ bilaterally   Cardiac:  normal S1, S2; RRR; no murmur  Lungs:  clear to auscultation bilaterally, no wheezing, rhonchi or rales  Abd: soft, nontender, no hepatomegaly  Ext: no edema Musculoskeletal:  No deformities, BUE and BLE strength normal and equal Skin: warm and dry  Neuro:  CNs 2-12 intact, no focal abnormalities  noted Psych:  Normal affect    Laboratory Data:  High Sensitivity Troponin:  No results for input(s): "TROPONINIHS" in the last 720 hours.    ChemistryNo results for input(s): "NA", "K", "CL", "CO2", "GLUCOSE", "BUN", "CREATININE", "CALCIUM", "MG", "GFRNONAA", "GFRAA", "ANIONGAP" in the last 168 hours.  No results for input(s): "PROT", "ALBUMIN", "AST", "ALT", "ALKPHOS", "BILITOT" in the last 168 hours. Lipids No results for input(s): "CHOL", "TRIG", "HDL", "LABVLDL", "LDLCALC", "CHOLHDL" in the last 168 hours. HematologyNo results for input(s): "WBC", "RBC", "HGB", "HCT", "MCV", "MCH", "MCHC", "RDW", "PLT" in the last 168 hours. Thyroid No results for input(s): "TSH", "FREET4" in the last 168 hours. BNPNo results for input(s): "BNP", "PROBNP" in the last 168 hours.  DDimer No results for input(s): "DDIMER" in the last 168 hours.   Radiology/Studies:  No results found.   Assessment and Plan:   #Abnormal stress test -Currently chest pain-free. -Risks stratification with A1C, TSH and lipid panel -continue ASA -start lipitor '20mg'$  daily (home pravastatin '80mg'$ ) -acquire a TTE -plan LHC 12/23/21  #Hypertension -continue home atenolol 100 daily and HCTZ 25 daily  #T2DM -hold home pioglitazone and glipizide  -insulin sliding scale  #Chronic pain -home regime methocarbamol '500mg'$  BID and oxycodone '15mg'$  q6h PRN  #Hypothyroidism -Continue synthroid 116mg  #GERD -Continue famotidine 20 twice daily and prilosec '40mg'$  BID  Risk Assessment/Risk Scores:    Severity of Illness: The appropriate patient status for this patient is INPATIENT. Inpatient status is judged to be reasonable and necessary in order to provide the required intensity of service to ensure the patient's safety. The patient's presenting symptoms, physical exam findings, and initial radiographic and laboratory data in the context of their chronic comorbidities is felt to place them at high risk for further clinical  deterioration. Furthermore, it is not anticipated that the patient will be medically stable for discharge from the hospital within 2 midnights of admission.   * I certify that at the point of admission it is my clinical judgment that the patient will require inpatient hospital care spanning beyond 2 midnights from the point of admission due to high intensity of service, high risk for further deterioration and high frequency of surveillance required.*   For questions or updates, please contact CBry[place] CityPlease consult [email or web address] for contact info under     Signed, FLaurice Record MD  12/21/2021 8:45 PM

## 2021-12-22 ENCOUNTER — Inpatient Hospital Stay (HOSPITAL_COMMUNITY): Payer: Medicare (Managed Care)

## 2021-12-22 DIAGNOSIS — R9439 Abnormal result of other cardiovascular function study: Secondary | ICD-10-CM

## 2021-12-22 DIAGNOSIS — R079 Chest pain, unspecified: Secondary | ICD-10-CM | POA: Diagnosis not present

## 2021-12-22 DIAGNOSIS — I10 Essential (primary) hypertension: Secondary | ICD-10-CM

## 2021-12-22 HISTORY — DX: Abnormal result of other cardiovascular function study: R94.39

## 2021-12-22 LAB — HEMOGLOBIN A1C
Hgb A1c MFr Bld: 7.9 % — ABNORMAL HIGH (ref 4.8–5.6)
Mean Plasma Glucose: 180.03 mg/dL

## 2021-12-22 LAB — CBC
HCT: 44.1 % (ref 39.0–52.0)
Hemoglobin: 15 g/dL (ref 13.0–17.0)
MCH: 31.7 pg (ref 26.0–34.0)
MCHC: 34 g/dL (ref 30.0–36.0)
MCV: 93.2 fL (ref 80.0–100.0)
Platelets: 232 10*3/uL (ref 150–400)
RBC: 4.73 MIL/uL (ref 4.22–5.81)
RDW: 12.7 % (ref 11.5–15.5)
WBC: 7.9 10*3/uL (ref 4.0–10.5)
nRBC: 0 % (ref 0.0–0.2)

## 2021-12-22 LAB — ECHOCARDIOGRAM COMPLETE
AR max vel: 4.72 cm2
AV Area VTI: 4.65 cm2
AV Area mean vel: 4.38 cm2
AV Mean grad: 2.7 mmHg
AV Peak grad: 4.8 mmHg
Ao pk vel: 1.09 m/s
Area-P 1/2: 2.48 cm2
Calc EF: 58.2 %
Height: 74 in
S' Lateral: 3.1 cm
Single Plane A2C EF: 53.5 %
Single Plane A4C EF: 60.8 %
Weight: 4624 oz

## 2021-12-22 LAB — BASIC METABOLIC PANEL
Anion gap: 11 (ref 5–15)
BUN: 17 mg/dL (ref 6–20)
CO2: 31 mmol/L (ref 22–32)
Calcium: 9.8 mg/dL (ref 8.9–10.3)
Chloride: 99 mmol/L (ref 98–111)
Creatinine, Ser: 1.24 mg/dL (ref 0.61–1.24)
GFR, Estimated: >60 mL/min (ref >60)
Glucose, Bld: 152 mg/dL — ABNORMAL HIGH (ref 70–99)
Potassium: 3.6 mmol/L (ref 3.5–5.1)
Sodium: 141 mmol/L (ref 135–145)

## 2021-12-22 LAB — LIPID PANEL
Cholesterol: 166 mg/dL (ref 0–200)
HDL: 46 mg/dL (ref >40)
LDL Cholesterol: 99 mg/dL (ref 0–99)
Total CHOL/HDL Ratio: 3.6 RATIO
Triglycerides: 106 mg/dL (ref <150)
VLDL: 21 mg/dL (ref 0–40)

## 2021-12-22 LAB — GLUCOSE, CAPILLARY
Glucose-Capillary: 158 mg/dL — ABNORMAL HIGH (ref 70–99)
Glucose-Capillary: 142 mg/dL — ABNORMAL HIGH (ref 70–99)
Glucose-Capillary: 141 mg/dL — ABNORMAL HIGH (ref 70–99)
Glucose-Capillary: 161 mg/dL — ABNORMAL HIGH (ref 70–99)
Glucose-Capillary: 172 mg/dL — ABNORMAL HIGH (ref 70–99)

## 2021-12-22 LAB — TSH: TSH: 106.949 u[IU]/mL — ABNORMAL HIGH (ref 0.350–4.500)

## 2021-12-22 LAB — HIV ANTIBODY (ROUTINE TESTING W REFLEX): HIV Screen 4th Generation wRfx: NONREACTIVE

## 2021-12-22 MED ORDER — SODIUM CHLORIDE 0.9 % WEIGHT BASED INFUSION
3.0000 mL/kg/h | INTRAVENOUS | Status: DC
  Administered 2021-12-23: 3 mL/kg/h via INTRAVENOUS

## 2021-12-22 MED ORDER — ASPIRIN 325 MG PO TABS: 325.0000 mg | ORAL_TABLET | Freq: Every day | ORAL | Status: DC

## 2021-12-22 MED ORDER — SODIUM CHLORIDE 0.9% FLUSH
3.0000 mL | Freq: Two times a day (BID) | INTRAVENOUS | Status: DC
  Administered 2021-12-23 – 2021-12-24 (×3): 3 mL via INTRAVENOUS

## 2021-12-22 MED ORDER — SODIUM CHLORIDE 0.9 % IV SOLN: 250.0000 mL | INTRAVENOUS | Status: DC | PRN

## 2021-12-22 MED ORDER — ASPIRIN 81 MG PO CHEW
81.0000 mg | CHEWABLE_TABLET | ORAL | Status: AC
  Administered 2021-12-23: 81 mg via ORAL
  Filled 2021-12-22: qty 1

## 2021-12-22 MED ORDER — METOPROLOL TARTRATE 50 MG PO TABS
50.0000 mg | ORAL_TABLET | Freq: Two times a day (BID) | ORAL | Status: DC
  Administered 2021-12-23 – 2021-12-24 (×3): 50 mg via ORAL
  Filled 2021-12-22 (×3): qty 1

## 2021-12-22 MED ORDER — FAMOTIDINE 20 MG PO TABS
20.0000 mg | ORAL_TABLET | Freq: Two times a day (BID) | ORAL | Status: DC
  Administered 2021-12-22 – 2021-12-24 (×6): 20 mg via ORAL
  Filled 2021-12-22 (×6): qty 1

## 2021-12-22 MED ORDER — PANTOPRAZOLE SODIUM 40 MG PO TBEC: 40.0000 mg | DELAYED_RELEASE_TABLET | Freq: Every day | ORAL | Status: DC

## 2021-12-22 MED ORDER — INSULIN ASPART 100 UNIT/ML IJ SOLN
0.0000 [IU] | Freq: Three times a day (TID) | INTRAMUSCULAR | Status: DC
  Administered 2021-12-22: 4 [IU] via SUBCUTANEOUS
  Administered 2021-12-22 – 2021-12-23 (×3): 3 [IU] via SUBCUTANEOUS
  Administered 2021-12-23: 4 [IU] via SUBCUTANEOUS
  Administered 2021-12-24 (×3): 3 [IU] via SUBCUTANEOUS

## 2021-12-22 MED ORDER — METHOCARBAMOL 500 MG PO TABS
500.0000 mg | ORAL_TABLET | Freq: Two times a day (BID) | ORAL | Status: DC
  Administered 2021-12-22 – 2021-12-24 (×6): 500 mg via ORAL
  Filled 2021-12-22 (×6): qty 1

## 2021-12-22 MED ORDER — ATORVASTATIN CALCIUM 10 MG PO TABS
20.0000 mg | ORAL_TABLET | Freq: Every day | ORAL | Status: DC
  Administered 2021-12-22: 20 mg via ORAL
  Filled 2021-12-22: qty 2

## 2021-12-22 MED ORDER — ASPIRIN 81 MG PO TBEC
81.0000 mg | DELAYED_RELEASE_TABLET | Freq: Every day | ORAL | Status: DC
  Administered 2021-12-24: 81 mg via ORAL
  Filled 2021-12-22 (×2): qty 1

## 2021-12-22 MED ORDER — HYDROCHLOROTHIAZIDE 25 MG PO TABS
25.0000 mg | ORAL_TABLET | Freq: Every day | ORAL | Status: DC
  Administered 2021-12-22: 25 mg via ORAL
  Filled 2021-12-22: qty 1

## 2021-12-22 MED ORDER — ACETAMINOPHEN 325 MG PO TABS
650.0000 mg | ORAL_TABLET | ORAL | Status: DC | PRN
  Administered 2021-12-22 – 2021-12-24 (×3): 650 mg via ORAL
  Filled 2021-12-22 (×3): qty 2

## 2021-12-22 MED ORDER — OXYCODONE HCL 5 MG PO TABS
15.0000 mg | ORAL_TABLET | Freq: Four times a day (QID) | ORAL | Status: DC | PRN
  Administered 2021-12-22 – 2021-12-23 (×5): 15 mg via ORAL
  Filled 2021-12-22 (×5): qty 3

## 2021-12-22 MED ORDER — ONDANSETRON HCL 4 MG/2ML IJ SOLN
4.0000 mg | Freq: Four times a day (QID) | INTRAMUSCULAR | Status: DC | PRN
  Administered 2021-12-23: 4 mg via INTRAVENOUS
  Filled 2021-12-22: qty 2

## 2021-12-22 MED ORDER — SODIUM CHLORIDE 0.9% FLUSH: 3.0000 mL | INTRAVENOUS | Status: DC | PRN

## 2021-12-22 MED ORDER — ENOXAPARIN SODIUM 40 MG/0.4ML IJ SOSY
40.0000 mg | PREFILLED_SYRINGE | Freq: Every day | INTRAMUSCULAR | Status: DC
  Administered 2021-12-22: 40 mg via SUBCUTANEOUS
  Filled 2021-12-22: qty 0.4

## 2021-12-22 MED ORDER — ATORVASTATIN CALCIUM 40 MG PO TABS
40.0000 mg | ORAL_TABLET | Freq: Every day | ORAL | Status: DC
  Administered 2021-12-23: 40 mg via ORAL
  Filled 2021-12-22: qty 1

## 2021-12-22 MED ORDER — MELATONIN 3 MG PO TABS: 3.0000 mg | ORAL_TABLET | Freq: Once | ORAL | Status: DC

## 2021-12-22 MED ORDER — PERFLUTREN LIPID MICROSPHERE
1.0000 mL | INTRAVENOUS | Status: AC | PRN
  Administered 2021-12-22: 2 mL via INTRAVENOUS

## 2021-12-22 MED ORDER — NITROGLYCERIN 0.4 MG SL SUBL: 0.4000 mg | SUBLINGUAL_TABLET | SUBLINGUAL | Status: DC | PRN

## 2021-12-22 MED ORDER — INSULIN ASPART 100 UNIT/ML IJ SOLN: 0.0000 [IU] | Freq: Every day | INTRAMUSCULAR | Status: DC

## 2021-12-22 MED ORDER — ATORVASTATIN CALCIUM 40 MG PO TABS: 40.0000 mg | ORAL_TABLET | Freq: Every day | ORAL | Status: DC

## 2021-12-22 MED ORDER — AMLODIPINE BESYLATE 5 MG PO TABS
5.0000 mg | ORAL_TABLET | Freq: Every day | ORAL | Status: DC
  Administered 2021-12-22 – 2021-12-24 (×3): 5 mg via ORAL
  Filled 2021-12-22 (×3): qty 1

## 2021-12-22 MED ORDER — LEVOTHYROXINE SODIUM 112 MCG PO TABS
112.0000 ug | ORAL_TABLET | Freq: Every day | ORAL | Status: DC
  Administered 2021-12-22 – 2021-12-23 (×2): 112 ug via ORAL
  Filled 2021-12-22 (×2): qty 1

## 2021-12-22 MED ORDER — ATENOLOL 25 MG PO TABS
100.0000 mg | ORAL_TABLET | Freq: Every day | ORAL | Status: DC
  Administered 2021-12-22: 100 mg via ORAL
  Filled 2021-12-22: qty 4

## 2021-12-22 MED ORDER — PANTOPRAZOLE SODIUM 40 MG PO TBEC
40.0000 mg | DELAYED_RELEASE_TABLET | Freq: Two times a day (BID) | ORAL | Status: DC
  Administered 2021-12-22 – 2021-12-24 (×6): 40 mg via ORAL
  Filled 2021-12-22 (×6): qty 1

## 2021-12-22 MED ORDER — SODIUM CHLORIDE 0.9 % WEIGHT BASED INFUSION
1.0000 mL/kg/h | INTRAVENOUS | Status: DC
  Administered 2021-12-23 (×2): 1 mL/kg/h via INTRAVENOUS

## 2021-12-22 NOTE — Progress Notes (Signed)
Progress Note  Patient Name: Derek Francis Date of Encounter: 12/22/2021  Primary Cardiologist: None  Brief HPI  Patient is a 61 year old M known to have HTN, DM 2, HLD, thyroid abnormality who is being transferred from outside hospital for the evaluation of abnormal stress test and potentially LHC.  Patient reported that he had first sudden onset of substernal chest pain on Tuesday afternoon that lasted for a few minutes before it resolved with SL NTG. However on Wednesday morning, the chest pain woke him up from sleep and lasted for about an hour. He came to the ER and was given SL NTG x 2 after which the pain resolved completely. Nitro patch was placed however he had terrible headaches with that due to which they remove the patch on Thursday afternoon. Soon after removal of the patch, patient developed similar quality of substernal 4/10 chest pain that has been persistent till date, was not able to sleep very well but pushed through it. He also stated that he did not have this kind of chest pain before. Denied tobacco use. He had his nephew pass away recently 2 weeks ago with a massive heart attack, majorly from cardiac syncope.  He had prior ischemic evaluation, LHC in 2022 was normal.  He continued to have chest pains even after the procedure.   Subjective   During my interview, he has 4 /10 substernal chest pain.  He also reported that this morning, he had a severe intensity of substernal chest pain radiating to his throat, left neck, left arm and left leg and waited for some time before it resolved spontaneously. Associated with nausea, dizziness, SOB.     Inpatient Medications    Scheduled Meds:  amLODipine  5 mg Oral Daily   [START ON 12/23/2021] aspirin EC  81 mg Oral Daily   atorvastatin  40 mg Oral QHS   famotidine  20 mg Oral BID   insulin aspart  0-20 Units Subcutaneous TID WC   insulin aspart  0-5 Units Subcutaneous QHS   levothyroxine  112 mcg Oral QAC breakfast    methocarbamol  500 mg Oral BID   [START ON 12/23/2021] metoprolol tartrate  50 mg Oral BID   pantoprazole  40 mg Oral BID   Continuous Infusions:  PRN Meds: acetaminophen, ondansetron (ZOFRAN) IV, oxyCODONE   Vital Signs    Vitals:   12/22/21 0600 12/22/21 0608 12/22/21 0631 12/22/21 0859  BP:  97/78 103/85 124/86  Pulse:  (!) 58 (!) 55 69  Resp: '14  15 12  '$ Temp:  (!) 97.5 F (36.4 C)  97.6 F (36.4 C)  TempSrc:  Oral  Oral  SpO2:  98%  98%  Weight:      Height:        Intake/Output Summary (Last 24 hours) at 12/22/2021 1015 Last data filed at 12/22/2021 0900 Gross per 24 hour  Intake 117 ml  Output --  Net 117 ml   Filed Weights   12/21/21 1725  Weight: 131.1 kg    Telemetry     Personally reviewed, HR normal  ECG    Ordered EKG now  Physical Exam   GEN: No acute distress.   Neck: No JVD. Cardiac: RRR, no murmur, rub, or gallop. Chest pain not reproducible on palpation. Respiratory: Nonlabored. Clear to auscultation bilaterally. GI: Soft, nontender, bowel sounds present. MS: No edema; No deformity. Neuro:  Nonfocal. Psych: Alert and oriented x 3. Normal affect.  Labs    Chemistry Recent Labs  Lab 12/22/21 0138  NA 141  K 3.6  CL 99  CO2 31  GLUCOSE 152*  BUN 17  CREATININE 1.24  CALCIUM 9.8  GFRNONAA >60  ANIONGAP 11     Hematology Recent Labs  Lab 12/22/21 0138  WBC 7.9  RBC 4.73  HGB 15.0  HCT 44.1  MCV 93.2  MCH 31.7  MCHC 34.0  RDW 12.7  PLT 232    Cardiac EnzymesNo results for input(s): "TROPONINIHS" in the last 720 hours.  BNPNo results for input(s): "BNP", "PROBNP" in the last 168 hours.   DDimerNo results for input(s): "DDIMER" in the last 168 hours.   Radiology    No results found.  Cardiac Studies   TTE normal LVEF MPS: Moderate reversible defect in the mid and the distal lateral and inferior walls   Assessment & Plan   Patient is a 61 year old male patient is a 61 year old M known to have HTN, DM 2,  hypothyroidism s/p ablation for history of Graves' disease, OSA, HLD was transferred from outside hospital for abnormal stress test and for potential cath.  #Positive functional study (moderate reversible defect in the mid and distal lateral and inferior walls) Plan -Continue aspirin 81 mg once daily -Switch atorvastatin 20 mg to 40 mg nightly -Plan for LHC on 12/23/2021. NPO after midnight. Risks and benefits of cardiac catheterization have been discussed with the patient.  These include bleeding, infection, kidney damage, stroke, heart attack, death. The patient understands these risks and is willing to proceed.  #Possibly cardiac chest pain Plan -Unclear etiology as the symptoms are at rest, but has cardiac quality and relieved by rest -Switch atenolol to metoprolol tartrate 50 mg twice daily -Start amlodipine 5 mg once daily -Hold off on Imdur due to severe headaches with it  #HTN, controlled PLAN -Stop HCTZ and start Amlodipine '5mg'$  once daily  #DM2 Plan -Insulin sliding scale  #Chronic pain Plan -Home regimen, oxycodone 15 mg every 6 as needed and methocarbamol 500 mg twice daily  #Graves' disease s/p ablation complicated by hypothyroidism Plan -Continue Synthroid 112 mcg  #GERD Plan -Continue famotidine 20 mg twice daily and Prilosec 40 mg twice daily   I have spent a total of 44 minutes with patient reviewing chart , telemetry, EKGs, labs and examining patient as well as establishing an assessment and plan that was discussed with the patient.  > 50% of time was spent in direct patient care.     Signed, Chalmers Guest, MD  12/22/2021, 10:15 AM

## 2021-12-22 NOTE — Progress Notes (Signed)
2D echo attempted, but patient was eating and wanted to finish. Will try echo later

## 2021-12-22 NOTE — Progress Notes (Deleted)
  Echocardiogram 2D Echocardiogram has been performed.  Derek Francis 12/22/2021, 1:37 PM

## 2021-12-22 NOTE — Progress Notes (Incomplete)
  Echocardiogram 2D Echocardiogram has been performed.  Derek Francis 12/22/2021, 3:31 PM

## 2021-12-23 ENCOUNTER — Encounter (HOSPITAL_COMMUNITY)
Admission: RE | Disposition: A | Discharge status: Home / Self Care | Payer: Self-pay | Source: Home / Self Care | Attending: Cardiology

## 2021-12-23 ENCOUNTER — Telehealth (HOSPITAL_COMMUNITY): Payer: Self-pay | Admitting: Pharmacy Technician

## 2021-12-23 ENCOUNTER — Other Ambulatory Visit (HOSPITAL_COMMUNITY): Payer: Self-pay

## 2021-12-23 ENCOUNTER — Inpatient Hospital Stay (HOSPITAL_COMMUNITY): Payer: Medicare (Managed Care)

## 2021-12-23 DIAGNOSIS — R9439 Abnormal result of other cardiovascular function study: Secondary | ICD-10-CM | POA: Diagnosis not present

## 2021-12-23 DIAGNOSIS — R0789 Other chest pain: Secondary | ICD-10-CM | POA: Diagnosis not present

## 2021-12-23 DIAGNOSIS — E118 Type 2 diabetes mellitus with unspecified complications: Secondary | ICD-10-CM | POA: Diagnosis not present

## 2021-12-23 DIAGNOSIS — E890 Postprocedural hypothyroidism: Secondary | ICD-10-CM | POA: Diagnosis not present

## 2021-12-23 DIAGNOSIS — I2510 Atherosclerotic heart disease of native coronary artery without angina pectoris: Secondary | ICD-10-CM | POA: Diagnosis not present

## 2021-12-23 HISTORY — PX: LEFT HEART CATH AND CORONARY ANGIOGRAPHY: CATH118249

## 2021-12-23 LAB — GLUCOSE, CAPILLARY
Glucose-Capillary: 120 mg/dL — ABNORMAL HIGH (ref 70–99)
Glucose-Capillary: 130 mg/dL — ABNORMAL HIGH (ref 70–99)
Glucose-Capillary: 145 mg/dL — ABNORMAL HIGH (ref 70–99)
Glucose-Capillary: 155 mg/dL — ABNORMAL HIGH (ref 70–99)
Glucose-Capillary: 143 mg/dL — ABNORMAL HIGH (ref 70–99)

## 2021-12-23 LAB — T4, FREE: Free T4: 0.55 ng/dL — ABNORMAL LOW (ref 0.61–1.12)

## 2021-12-23 SURGERY — LEFT HEART CATH AND CORONARY ANGIOGRAPHY
Anesthesia: LOCAL

## 2021-12-23 MED ORDER — MIDAZOLAM HCL 2 MG/2ML IJ SOLN
INTRAMUSCULAR | Status: AC
  Filled 2021-12-23: qty 2

## 2021-12-23 MED ORDER — HYDROMORPHONE HCL ER 8 MG PO TB24
8.0000 mg | ORAL_TABLET | Freq: Every day | ORAL | Status: DC
  Administered 2021-12-23: 8 mg via ORAL
  Filled 2021-12-23 (×2): qty 1

## 2021-12-23 MED ORDER — IOHEXOL 350 MG/ML SOLN
INTRAVENOUS | Status: DC | PRN
  Administered 2021-12-23: 80 mL via INTRA_ARTERIAL

## 2021-12-23 MED ORDER — HEPARIN (PORCINE) IN NACL 1000-0.9 UT/500ML-% IV SOLN
INTRAVENOUS | Status: DC | PRN
  Administered 2021-12-23 (×2): 500 mL

## 2021-12-23 MED ORDER — HEPARIN SODIUM (PORCINE) 1000 UNIT/ML IJ SOLN
INTRAMUSCULAR | Status: AC
  Filled 2021-12-23: qty 10

## 2021-12-23 MED ORDER — FENTANYL CITRATE (PF) 100 MCG/2ML IJ SOLN
INTRAMUSCULAR | Status: DC | PRN
  Administered 2021-12-23: 25 ug via INTRAVENOUS
  Administered 2021-12-23: 50 ug via INTRAVENOUS

## 2021-12-23 MED ORDER — LEVOTHYROXINE SODIUM 112 MCG PO TABS
224.0000 ug | ORAL_TABLET | Freq: Every day | ORAL | Status: DC
  Administered 2021-12-24: 224 ug via ORAL
  Filled 2021-12-23: qty 2

## 2021-12-23 MED ORDER — FENTANYL CITRATE (PF) 100 MCG/2ML IJ SOLN
INTRAMUSCULAR | Status: AC
  Filled 2021-12-23: qty 2

## 2021-12-23 MED ORDER — HEPARIN SODIUM (PORCINE) 1000 UNIT/ML IJ SOLN
INTRAMUSCULAR | Status: DC | PRN
  Administered 2021-12-23: 6000 [IU] via INTRAVENOUS

## 2021-12-23 MED ORDER — OXYCODONE HCL 5 MG PO TABS
15.0000 mg | ORAL_TABLET | Freq: Four times a day (QID) | ORAL | Status: DC
  Administered 2021-12-23 – 2021-12-24 (×6): 15 mg via ORAL
  Filled 2021-12-23 (×7): qty 3

## 2021-12-23 MED ORDER — LIDOCAINE HCL (PF) 1 % IJ SOLN
INTRAMUSCULAR | Status: DC | PRN
  Administered 2021-12-23: 2 mL

## 2021-12-23 MED ORDER — GABAPENTIN 300 MG PO CAPS
600.0000 mg | ORAL_CAPSULE | Freq: Every evening | ORAL | Status: DC | PRN
  Administered 2021-12-23: 600 mg via ORAL
  Filled 2021-12-23: qty 2

## 2021-12-23 MED ORDER — SODIUM CHLORIDE 0.9% FLUSH
3.0000 mL | Freq: Two times a day (BID) | INTRAVENOUS | Status: DC
  Administered 2021-12-23: 3 mL via INTRAVENOUS

## 2021-12-23 MED ORDER — LIDOCAINE HCL (PF) 1 % IJ SOLN
INTRAMUSCULAR | Status: AC
  Filled 2021-12-23: qty 30

## 2021-12-23 MED ORDER — LEVOTHYROXINE SODIUM 112 MCG PO TABS
112.0000 ug | ORAL_TABLET | Freq: Once | ORAL | Status: AC
  Administered 2021-12-23: 112 ug via ORAL
  Filled 2021-12-23: qty 1

## 2021-12-23 MED ORDER — HEPARIN (PORCINE) IN NACL 1000-0.9 UT/500ML-% IV SOLN
INTRAVENOUS | Status: AC
  Filled 2021-12-23: qty 1000

## 2021-12-23 MED ORDER — OXYCODONE HCL 5 MG PO TABS: 15.0000 mg | ORAL_TABLET | Freq: Four times a day (QID) | ORAL | Status: DC

## 2021-12-23 MED ORDER — SODIUM CHLORIDE 0.9 % IV SOLN: 250.0000 mL | INTRAVENOUS | Status: DC | PRN

## 2021-12-23 MED ORDER — VERAPAMIL HCL 2.5 MG/ML IV SOLN
INTRAVENOUS | Status: DC | PRN
  Administered 2021-12-23: 10 mL via INTRA_ARTERIAL

## 2021-12-23 MED ORDER — LEVOTHYROXINE SODIUM 112 MCG PO TABS: 112.0000 ug | ORAL_TABLET | Freq: Every day | ORAL | Status: DC

## 2021-12-23 MED ORDER — SODIUM CHLORIDE 0.9% FLUSH: 3.0000 mL | INTRAVENOUS | Status: DC | PRN

## 2021-12-23 MED ORDER — SODIUM CHLORIDE 0.9 % WEIGHT BASED INFUSION
1.0000 mL/kg/h | INTRAVENOUS | Status: AC
  Administered 2021-12-23: 1 mL/kg/h via INTRAVENOUS

## 2021-12-23 MED ORDER — MIDAZOLAM HCL 2 MG/2ML IJ SOLN
INTRAMUSCULAR | Status: DC | PRN
  Administered 2021-12-23 (×2): 1 mg via INTRAVENOUS

## 2021-12-23 MED ORDER — VERAPAMIL HCL 2.5 MG/ML IV SOLN
INTRAVENOUS | Status: AC
  Filled 2021-12-23: qty 2

## 2021-12-23 SURGICAL SUPPLY — 10 items
BAND ZEPHYR COMPRESS 30 LONG (HEMOSTASIS) IMPLANT
CATH 5FR JL3.5 JR4 ANG PIG MP (CATHETERS) IMPLANT
GLIDESHEATH SLEND SS 6F .021 (SHEATH) IMPLANT
GUIDEWIRE INQWIRE 1.5J.035X260 (WIRE) IMPLANT
INQWIRE 1.5J .035X260CM (WIRE) ×1
KIT HEART LEFT (KITS) ×1 IMPLANT
PACK CARDIAC CATHETERIZATION (CUSTOM PROCEDURE TRAY) ×1 IMPLANT
SHEATH PROBE COVER 6X72 (BAG) IMPLANT
TRANSDUCER W/STOPCOCK (MISCELLANEOUS) ×1 IMPLANT
TUBING CIL FLEX 10 FLL-RA (TUBING) ×1 IMPLANT

## 2021-12-23 NOTE — Care Management (Signed)
  Transition of Care [facility]) Screening Note   Patient Details  Name: Derek Francis Date of Birth: 05-Aug-1960   Transition of [facility]) CM/SW Contact:    Bethena Roys, RN Phone Number: 12/23/2021, 3:41 PM    Transition of Care Department [facility]) has reviewed the patient and no TOC needs have been identified at this time. We will continue to monitor patient advancement through interdisciplinary progression rounds. If new patient transition needs arise, please place a TOC consult.

## 2021-12-23 NOTE — Plan of Care (Signed)
  Problem: Education: Goal: Knowledge of General Education information will improve Description: Including pain rating scale, medication(s)/side effects and non-pharmacologic comfort measures Outcome: Progressing   Problem: Health Behavior/Discharge Planning: Goal: Ability to manage health-related needs will improve Outcome: Progressing   Problem: Clinical Measurements: Goal: Ability to maintain clinical measurements within normal limits will improve Outcome: Progressing Goal: Will remain free from infection Outcome: Progressing Goal: Diagnostic test results will improve Outcome: Progressing Goal: Respiratory complications will improve Outcome: Progressing Goal: Cardiovascular complication will be avoided Outcome: Progressing   Problem: Activity: Goal: Risk for activity intolerance will decrease Outcome: Progressing   Problem: Nutrition: Goal: Adequate nutrition will be maintained Outcome: Progressing   Problem: Coping: Goal: Level of anxiety will decrease Outcome: Progressing   Problem: Elimination: Goal: Will not experience complications related to bowel motility Outcome: Progressing Goal: Will not experience complications related to urinary retention Outcome: Progressing   Problem: Pain Managment: Goal: General experience of comfort will improve Outcome: Progressing   Problem: Safety: Goal: Ability to remain free from injury will improve Outcome: Progressing   Problem: Skin Integrity: Goal: Risk for impaired skin integrity will decrease Outcome: Progressing   Problem: Education: Goal: Understanding of cardiac disease, CV risk reduction, and recovery process will improve Outcome: Progressing Goal: Individualized Educational Video(s) Outcome: Progressing   Problem: Activity: Goal: Ability to tolerate increased activity will improve Outcome: Progressing   Problem: Cardiac: Goal: Ability to achieve and maintain adequate cardiovascular perfusion will  improve Outcome: Progressing   Problem: Health Behavior/Discharge Planning: Goal: Ability to safely manage health-related needs after discharge will improve Outcome: Progressing   Problem: Education: Goal: Ability to describe self-care measures that may prevent or decrease complications (Diabetes Survival Skills Education) will improve Outcome: Progressing Goal: Individualized Educational Video(s) Outcome: Progressing   Problem: Coping: Goal: Ability to adjust to condition or change in health will improve Outcome: Progressing   Problem: Fluid Volume: Goal: Ability to maintain a balanced intake and output will improve Outcome: Progressing   Problem: Health Behavior/Discharge Planning: Goal: Ability to identify and utilize available resources and services will improve Outcome: Progressing Goal: Ability to manage health-related needs will improve Outcome: Progressing   Problem: Metabolic: Goal: Ability to maintain appropriate glucose levels will improve Outcome: Progressing   Problem: Nutritional: Goal: Maintenance of adequate nutrition will improve Outcome: Progressing Goal: Progress toward achieving an optimal weight will improve Outcome: Progressing   Problem: Skin Integrity: Goal: Risk for impaired skin integrity will decrease Outcome: Progressing   Problem: Tissue Perfusion: Goal: Adequacy of tissue perfusion will improve Outcome: Progressing   Problem: Education: Goal: Understanding of CV disease, CV risk reduction, and recovery process will improve Outcome: Progressing Goal: Individualized Educational Video(s) Outcome: Progressing   Problem: Activity: Goal: Ability to return to baseline activity level will improve Outcome: Progressing   Problem: Cardiovascular: Goal: Ability to achieve and maintain adequate cardiovascular perfusion will improve Outcome: Progressing Goal: Vascular access site(s) Level 0-1 will be maintained Outcome: Progressing    Problem: Health Behavior/Discharge Planning: Goal: Ability to safely manage health-related needs after discharge will improve Outcome: Progressing

## 2021-12-23 NOTE — Progress Notes (Addendum)
Rounding Note    Patient Name: Derek Francis Date of Encounter: 12/23/2021  [facility] Cardiologist: None   Subjective   Mild intermittent episodes of chest pain overnight. Long discussion regarding medications this morning.   Inpatient Medications    Scheduled Meds:  amLODipine  5 mg Oral Daily   aspirin EC  81 mg Oral Daily   atorvastatin  40 mg Oral QHS   famotidine  20 mg Oral BID   insulin aspart  0-20 Units Subcutaneous TID WC   insulin aspart  0-5 Units Subcutaneous QHS   levothyroxine  112 mcg Oral QAC breakfast   melatonin  3 mg Oral Once   methocarbamol  500 mg Oral BID   metoprolol tartrate  50 mg Oral BID   pantoprazole  40 mg Oral BID   sodium chloride flush  3 mL Intravenous Q12H   Continuous Infusions:  sodium chloride     sodium chloride 1 mL/kg/hr (12/23/21 0526)   PRN Meds: sodium chloride, acetaminophen, ondansetron (ZOFRAN) IV, oxyCODONE, sodium chloride flush   Vital Signs    Vitals:   12/22/21 1135 12/22/21 1652 12/22/21 2041 12/23/21 0530  BP: (!) 127/91 104/76 117/86 118/83  Pulse: 60 60 67 (!) 54  Resp: '15 16 17 16  '$ Temp: 97.7 F (36.5 C) (!) 97.2 F (36.2 C) (!) 97.5 F (36.4 C) 97.6 F (36.4 C)  TempSrc: Oral Oral Oral Oral  SpO2: 98% 95% 97% 96%  Weight:      Height:        Intake/Output Summary (Last 24 hours) at 12/23/2021 0748 Last data filed at 12/22/2021 2158 Gross per 24 hour  Intake 357 ml  Output --  Net 357 ml      12/21/2021    5:25 PM  Last 3 Weights  Weight (lbs) 289 lb  Weight (kg) 131.09 kg      Telemetry    Sinus Bradycardia - Personally Reviewed  ECG    Sinus Bradycardia, 52 bpm  Physical Exam   GEN: No acute distress.   Neck: No JVD Cardiac: RRR, no murmurs, rubs, or gallops.  Respiratory: Clear to auscultation bilaterally. GI: Soft, nontender, non-distended  MS: No edema; No deformity. Neuro:  Nonfocal  Psych: Normal affect   Labs    High Sensitivity Troponin:  No  results for input(s): "TROPONINIHS" in the last 720 hours.   Chemistry Recent Labs  Lab 12/22/21 0138  NA 141  K 3.6  CL 99  CO2 31  GLUCOSE 152*  BUN 17  CREATININE 1.24  CALCIUM 9.8  GFRNONAA >60  ANIONGAP 11    Lipids  Recent Labs  Lab 12/22/21 0138  CHOL 166  TRIG 106  HDL 46  LDLCALC 99  CHOLHDL 3.6    Hematology Recent Labs  Lab 12/22/21 0138  WBC 7.9  RBC 4.73  HGB 15.0  HCT 44.1  MCV 93.2  MCH 31.7  MCHC 34.0  RDW 12.7  PLT 232   Thyroid  Recent Labs  Lab 12/22/21 0138  TSH 106.949*    BNPNo results for input(s): "BNP", "PROBNP" in the last 168 hours.  DDimer No results for input(s): "DDIMER" in the last 168 hours.   Radiology    ECHOCARDIOGRAM COMPLETE  Result Date: 12/22/2021    ECHOCARDIOGRAM REPORT   Patient Name:   PELLEGRINO KENNARD Date of Exam: 12/22/2021 Medical Rec #:  [id number]       Height:       74.0 in  Accession #:    [id number]      Weight:       289.0 lb Date of Birth:  [birth year]       BSA:          2.542 m Patient Age:    3 years        BP:           103/85 mmHg Patient Gender: M               HR:           59 bpm. Exam Location:  Inpatient Procedure: 2D Echo, Color Doppler, Cardiac Doppler and Intracardiac            Opacification Agent Indications:    R07.9* Chest pain, unspecified  History:        Patient has no prior history of Echocardiogram examinations.                 Signs/Symptoms:Chest Pain. Abnormal stress test.  Sonographer:    Roseanna Rainbow RDCS Referring Phys: [id number] FAN YE  Sonographer Comments: Technically difficult study due to poor echo windows, suboptimal parasternal window, suboptimal apical window, no subcostal window and patient is obese. Image acquisition challenging due to patient body habitus. IMPRESSIONS  1. Left ventricular ejection fraction, by estimation, is 60 to 65%. The left ventricle has normal function. The left ventricle has no regional wall motion abnormalities. There is mild left ventricular hypertrophy.  Left ventricular diastolic parameters are consistent with Grade I diastolic dysfunction (impaired relaxation).  2. Right ventricular systolic function was not well visualized. The right ventricular size is not well visualized. Tricuspid regurgitation signal is inadequate for assessing PA pressure.  3. The mitral valve is normal in structure. No evidence of mitral valve regurgitation. No evidence of mitral stenosis.  4. The aortic valve is tricuspid. Aortic valve regurgitation is not visualized. No aortic stenosis is present.  5. Aortic dilatation noted. There is mild dilatation of the aortic root, measuring 42 mm. There is mild dilatation of the ascending aorta, measuring 36 mm. FINDINGS  Left Ventricle: Left ventricular ejection fraction, by estimation, is 60 to 65%. The left ventricle has normal function. The left ventricle has no regional wall motion abnormalities. Definity contrast agent was given IV to delineate the left ventricular  endocardial borders. The left ventricular internal cavity size was normal in size. There is mild left ventricular hypertrophy. Left ventricular diastolic parameters are consistent with Grade I diastolic dysfunction (impaired relaxation). Normal left ventricular filling pressure. Right Ventricle: The right ventricular size is not well visualized. Right vetricular wall thickness was not well visualized. Right ventricular systolic function was not well visualized. Tricuspid regurgitation signal is inadequate for assessing PA pressure. Left Atrium: Left atrial size was normal in size. Right Atrium: Right atrial size was normal in size. Pericardium: There is no evidence of pericardial effusion. Mitral Valve: The mitral valve is normal in structure. No evidence of mitral valve regurgitation. No evidence of mitral valve stenosis. Tricuspid Valve: The tricuspid valve is not well visualized. Tricuspid valve regurgitation is not demonstrated. No evidence of tricuspid stenosis. Aortic Valve:  The aortic valve is tricuspid. Aortic valve regurgitation is not visualized. No aortic stenosis is present. Aortic valve mean gradient measures 2.7 mmHg. Aortic valve peak gradient measures 4.8 mmHg. Aortic valve area, by VTI measures 4.65 cm. Pulmonic Valve: The pulmonic valve was not well visualized. Pulmonic valve regurgitation is not visualized. No evidence of pulmonic stenosis. Aorta: Aortic dilatation  noted. There is mild dilatation of the aortic root, measuring 42 mm. There is mild dilatation of the ascending aorta, measuring 36 mm. Venous: The inferior vena cava was not well visualized. IAS/Shunts: The interatrial septum was not well visualized.  LEFT VENTRICLE PLAX 2D LVIDd:         4.90 cm      Diastology LVIDs:         3.10 cm      LV e' medial:    4.57 cm/s LV PW:         1.10 cm      LV E/e' medial:  12.1 LV IVS:        1.10 cm      LV e' lateral:   9.46 cm/s LVOT diam:     2.80 cm      LV E/e' lateral: 5.8 LV SV:         118 LV SV Index:   46 LVOT Area:     6.16 cm  LV Volumes (MOD) LV vol d, MOD A2C: 76.6 ml LV vol d, MOD A4C: 108.0 ml LV vol s, MOD A2C: 35.6 ml LV vol s, MOD A4C: 42.3 ml LV SV MOD A2C:     41.0 ml LV SV MOD A4C:     108.0 ml LV SV MOD BP:      53.4 ml RIGHT VENTRICLE RV S prime:     10.10 cm/s TAPSE (M-mode): 1.5 cm LEFT ATRIUM             Index        RIGHT ATRIUM           Index LA diam:        4.20 cm 1.65 cm/m   RA Area:     11.80 cm LA Vol (A2C):   37.3 ml 14.68 ml/m  RA Volume:   24.00 ml  9.44 ml/m LA Vol (A4C):   43.2 ml 17.00 ml/m LA Biplane Vol: 41.2 ml 16.21 ml/m  AORTIC VALVE AV Area (Vmax):    4.72 cm AV Area (Vmean):   4.38 cm AV Area (VTI):     4.65 cm AV Vmax:           109.09 cm/s AV Vmean:          75.680 cm/s AV VTI:            0.253 m AV Peak Grad:      4.8 mmHg AV Mean Grad:      2.7 mmHg LVOT Vmax:         83.60 cm/s LVOT Vmean:        53.800 cm/s LVOT VTI:          0.191 m LVOT/AV VTI ratio: 0.75  AORTA Ao Root diam: 4.20 cm Ao Asc diam:  3.60 cm  MITRAL VALVE MV Area (PHT): 2.48 cm    SHUNTS MV Decel Time: 306 msec    Systemic VTI:  0.19 m MV E velocity: 55.10 cm/s  Systemic Diam: 2.80 cm MV A velocity: 72.40 cm/s MV E/A ratio:  0.76 Carlyle Dolly MD Electronically signed by Carlyle Dolly MD Signature Date/Time: 12/22/2021/3:41:47 PM    Final     Cardiac Studies   Echo: 12/22/21  IMPRESSIONS     1. Left ventricular ejection fraction, by estimation, is 60 to 65%. The  left ventricle has normal function. The left ventricle has no regional  wall motion abnormalities. There is mild left ventricular hypertrophy.  Left  ventricular diastolic parameters  are consistent with Grade I diastolic dysfunction (impaired relaxation).   2. Right ventricular systolic function was not well visualized. The right  ventricular size is not well visualized. Tricuspid regurgitation signal is  inadequate for assessing PA pressure.   3. The mitral valve is normal in structure. No evidence of mitral valve  regurgitation. No evidence of mitral stenosis.   4. The aortic valve is tricuspid. Aortic valve regurgitation is not  visualized. No aortic stenosis is present.   5. Aortic dilatation noted. There is mild dilatation of the aortic root,  measuring 42 mm. There is mild dilatation of the ascending aorta,  measuring 36 mm.   FINDINGS   Left Ventricle: Left ventricular ejection fraction, by estimation, is 60  to 65%. The left ventricle has normal function. The left ventricle has no  regional wall motion abnormalities. Definity contrast agent was given IV  to delineate the left ventricular   endocardial borders. The left ventricular internal cavity size was normal  in size. There is mild left ventricular hypertrophy. Left ventricular  diastolic parameters are consistent with Grade I diastolic dysfunction  (impaired relaxation). Normal left  ventricular filling pressure.   Right Ventricle: The right ventricular size is not well visualized. Right   vetricular wall thickness was not well visualized. Right ventricular  systolic function was not well visualized. Tricuspid regurgitation signal  is inadequate for assessing PA  pressure.   Left Atrium: Left atrial size was normal in size.   Right Atrium: Right atrial size was normal in size.   Pericardium: There is no evidence of pericardial effusion.   Mitral Valve: The mitral valve is normal in structure. No evidence of  mitral valve regurgitation. No evidence of mitral valve stenosis.   Tricuspid Valve: The tricuspid valve is not well visualized. Tricuspid  valve regurgitation is not demonstrated. No evidence of tricuspid  stenosis.   Aortic Valve: The aortic valve is tricuspid. Aortic valve regurgitation is  not visualized. No aortic stenosis is present. Aortic valve mean gradient  measures 2.7 mmHg. Aortic valve peak gradient measures 4.8 mmHg. Aortic  valve area, by VTI measures 4.65  cm.   Pulmonic Valve: The pulmonic valve was not well visualized. Pulmonic valve  regurgitation is not visualized. No evidence of pulmonic stenosis.   Aorta: Aortic dilatation noted. There is mild dilatation of the aortic  root, measuring 42 mm. There is mild dilatation of the ascending aorta,  measuring 36 mm.   Venous: The inferior vena cava was not well visualized.   IAS/Shunts: The interatrial septum was not well visualized.   Patient Profile     61 y.o. male with PMH of HTN, DM 2, HLD, thyroid abnormality who was transferred from outside hospital for the evaluation of abnormal stress test and potentially LHC.  Assessment & Plan    Abnormal Stress test Chest pain -- transferred from [facility] after abnormal stress test with moderate reversible defect in the m/d lateral and inferior wall consistent with inducible myocardial ischemia. Planned for cardiac cath today -- continue ASA, statin, metoprolol, amlodipine  HTN -- stable -- continue metoprolol and amlodipine, HCTZ  was stopped on admission   DM -- Hgb A1c 7.9 -- on Actos PTA, was on ozempic but now to expensive  -- continue SSI -- discussed starting back on metformin as first line med, along with potential SGLT2   Graves' Disease s/p ablation c/b Hypothyroidism -- TSH 106 on admission  -- outpatient synthroid dose suppose to  be 24mg daily (only on 1172m here) will adjust -- reports he misses doses at home to be able to take his pain medication, advised he will need outpatient follow up with PCP at follow up -- check T3, T4  GERD -- continue PPI  HLD -- continue atorvastatin '40mg'$  daily   Chronic pain -- continue home regimen (pharmD adjusting to match home medications)  For questions or updates, please contact [facility] consult [email or web address] for contact info under        Signed, LiReino BellisNP  12/23/2021, 7:48 AM     Patient seen and examined. Agree with assessment and plan.  Patient has experienced intermittent episodes of left-sided chest discomfort.  Scheduled for definitive cardiac catheterization today after an outpatient nuclear stress test raise concern for possible reversible defect in the mid lateral and inferior wall concerning for ischemia.  He has a history of remote Graves' disease status post ablation with subsequent hypothyroidism.  [facility] instead of [facility] previous dose.  However TSH today is significantly elevated at 120 most likely is consistent with more long-term optimal L-thyroxine therapy.  I discussed need for improved treatment.  He has been followed by his primary physician and not endocrinology.  Endo evaluation may be helpful in the future.  Pressure today is stable on regimen consisting of morphine 5 mg and metoprolol 50 mg twice a day.  He is diabetic and remotely had been on Ozempic discontinued due to expense.  He may benefit from SGLT2 in  addition.I have reviewed the risks, indications, and alternatives to cardiac catheterization, possible angioplasty, and stenting with the patient. Risks include but are not limited to bleeding, infection, vascular injury, stroke, myocardial infection, arrhythmia, kidney injury, radiation-related injury in the case of prolonged fluoroscopy use, emergency cardiac surgery, and death. The patient understands the risks of serious complication is 1-2 in [id number] diagnostic cardiac cath and 1-2% or less with angioplasty/stenting.  Plan cath today.   ThTroy SineMD, [facility]AFres[facility]0/23/2023 11:50 AM

## 2021-12-23 NOTE — H&P (View-Only) (Signed)
Rounding Note    Patient Name: Derek Francis Date of Encounter: 12/23/2021  [facility] Cardiologist: None   Subjective   Mild intermittent episodes of chest pain overnight. Long discussion regarding medications this morning.   Inpatient Medications    Scheduled Meds:  amLODipine  5 mg Oral Daily   aspirin EC  81 mg Oral Daily   atorvastatin  40 mg Oral QHS   famotidine  20 mg Oral BID   insulin aspart  0-20 Units Subcutaneous TID WC   insulin aspart  0-5 Units Subcutaneous QHS   levothyroxine  112 mcg Oral QAC breakfast   melatonin  3 mg Oral Once   methocarbamol  500 mg Oral BID   metoprolol tartrate  50 mg Oral BID   pantoprazole  40 mg Oral BID   sodium chloride flush  3 mL Intravenous Q12H   Continuous Infusions:  sodium chloride     sodium chloride 1 mL/kg/hr (12/23/21 0526)   PRN Meds: sodium chloride, acetaminophen, ondansetron (ZOFRAN) IV, oxyCODONE, sodium chloride flush   Vital Signs    Vitals:   12/22/21 1135 12/22/21 1652 12/22/21 2041 12/23/21 0530  BP: (!) 127/91 104/76 117/86 118/83  Pulse: 60 60 67 (!) 54  Resp: '15 16 17 16  '$ Temp: 97.7 F (36.5 C) (!) 97.2 F (36.2 C) (!) 97.5 F (36.4 C) 97.6 F (36.4 C)  TempSrc: Oral Oral Oral Oral  SpO2: 98% 95% 97% 96%  Weight:      Height:        Intake/Output Summary (Last 24 hours) at 12/23/2021 0748 Last data filed at 12/22/2021 2158 Gross per 24 hour  Intake 357 ml  Output --  Net 357 ml      12/21/2021    5:25 PM  Last 3 Weights  Weight (lbs) 289 lb  Weight (kg) 131.09 kg      Telemetry    Sinus Bradycardia - Personally Reviewed  ECG    Sinus Bradycardia, 52 bpm  Physical Exam   GEN: No acute distress.   Neck: No JVD Cardiac: RRR, no murmurs, rubs, or gallops.  Respiratory: Clear to auscultation bilaterally. GI: Soft, nontender, non-distended  MS: No edema; No deformity. Neuro:  Nonfocal  Psych: Normal affect   Labs    High Sensitivity Troponin:  No  results for input(s): "TROPONINIHS" in the last 720 hours.   Chemistry Recent Labs  Lab 12/22/21 0138  NA 141  K 3.6  CL 99  CO2 31  GLUCOSE 152*  BUN 17  CREATININE 1.24  CALCIUM 9.8  GFRNONAA >60  ANIONGAP 11    Lipids  Recent Labs  Lab 12/22/21 0138  CHOL 166  TRIG 106  HDL 46  LDLCALC 99  CHOLHDL 3.6    Hematology Recent Labs  Lab 12/22/21 0138  WBC 7.9  RBC 4.73  HGB 15.0  HCT 44.1  MCV 93.2  MCH 31.7  MCHC 34.0  RDW 12.7  PLT 232   Thyroid  Recent Labs  Lab 12/22/21 0138  TSH 106.949*    BNPNo results for input(s): "BNP", "PROBNP" in the last 168 hours.  DDimer No results for input(s): "DDIMER" in the last 168 hours.   Radiology    ECHOCARDIOGRAM COMPLETE  Result Date: 12/22/2021    ECHOCARDIOGRAM REPORT   Patient Name:   Derek Francis Date of Exam: 12/22/2021 Medical Rec #:  [id number]       Height:       74.0 in  Accession #:    [id number]      Weight:       289.0 lb Date of Birth:  Sep 30, 1960       BSA:          2.542 m Patient Age:    61 years        BP:           103/85 mmHg Patient Gender: M               HR:           59 bpm. Exam Location:  Inpatient Procedure: 2D Echo, Color Doppler, Cardiac Doppler and Intracardiac            Opacification Agent Indications:    R07.9* Chest pain, unspecified  History:        Patient has no prior history of Echocardiogram examinations.                 Signs/Symptoms:Chest Pain. Abnormal stress test.  Sonographer:    Roseanna Rainbow RDCS Referring Phys: [id number] FAN YE  Sonographer Comments: Technically difficult study due to poor echo windows, suboptimal parasternal window, suboptimal apical window, no subcostal window and patient is obese. Image acquisition challenging due to patient body habitus. IMPRESSIONS  1. Left ventricular ejection fraction, by estimation, is 60 to 65%. The left ventricle has normal function. The left ventricle has no regional wall motion abnormalities. There is mild left ventricular hypertrophy.  Left ventricular diastolic parameters are consistent with Grade I diastolic dysfunction (impaired relaxation).  2. Right ventricular systolic function was not well visualized. The right ventricular size is not well visualized. Tricuspid regurgitation signal is inadequate for assessing PA pressure.  3. The mitral valve is normal in structure. No evidence of mitral valve regurgitation. No evidence of mitral stenosis.  4. The aortic valve is tricuspid. Aortic valve regurgitation is not visualized. No aortic stenosis is present.  5. Aortic dilatation noted. There is mild dilatation of the aortic root, measuring 42 mm. There is mild dilatation of the ascending aorta, measuring 36 mm. FINDINGS  Left Ventricle: Left ventricular ejection fraction, by estimation, is 60 to 65%. The left ventricle has normal function. The left ventricle has no regional wall motion abnormalities. Definity contrast agent was given IV to delineate the left ventricular  endocardial borders. The left ventricular internal cavity size was normal in size. There is mild left ventricular hypertrophy. Left ventricular diastolic parameters are consistent with Grade I diastolic dysfunction (impaired relaxation). Normal left ventricular filling pressure. Right Ventricle: The right ventricular size is not well visualized. Right vetricular wall thickness was not well visualized. Right ventricular systolic function was not well visualized. Tricuspid regurgitation signal is inadequate for assessing PA pressure. Left Atrium: Left atrial size was normal in size. Right Atrium: Right atrial size was normal in size. Pericardium: There is no evidence of pericardial effusion. Mitral Valve: The mitral valve is normal in structure. No evidence of mitral valve regurgitation. No evidence of mitral valve stenosis. Tricuspid Valve: The tricuspid valve is not well visualized. Tricuspid valve regurgitation is not demonstrated. No evidence of tricuspid stenosis. Aortic Valve:  The aortic valve is tricuspid. Aortic valve regurgitation is not visualized. No aortic stenosis is present. Aortic valve mean gradient measures 2.7 mmHg. Aortic valve peak gradient measures 4.8 mmHg. Aortic valve area, by VTI measures 4.65 cm. Pulmonic Valve: The pulmonic valve was not well visualized. Pulmonic valve regurgitation is not visualized. No evidence of pulmonic stenosis. Aorta: Aortic dilatation  noted. There is mild dilatation of the aortic root, measuring 42 mm. There is mild dilatation of the ascending aorta, measuring 36 mm. Venous: The inferior vena cava was not well visualized. IAS/Shunts: The interatrial septum was not well visualized.  LEFT VENTRICLE PLAX 2D LVIDd:         4.90 cm      Diastology LVIDs:         3.10 cm      LV e' medial:    4.57 cm/s LV PW:         1.10 cm      LV E/e' medial:  12.1 LV IVS:        1.10 cm      LV e' lateral:   9.46 cm/s LVOT diam:     2.80 cm      LV E/e' lateral: 5.8 LV SV:         118 LV SV Index:   46 LVOT Area:     6.16 cm  LV Volumes (MOD) LV vol d, MOD A2C: 76.6 ml LV vol d, MOD A4C: 108.0 ml LV vol s, MOD A2C: 35.6 ml LV vol s, MOD A4C: 42.3 ml LV SV MOD A2C:     41.0 ml LV SV MOD A4C:     108.0 ml LV SV MOD BP:      53.4 ml RIGHT VENTRICLE RV S prime:     10.10 cm/s TAPSE (M-mode): 1.5 cm LEFT ATRIUM             Index        RIGHT ATRIUM           Index LA diam:        4.20 cm 1.65 cm/m   RA Area:     11.80 cm LA Vol (A2C):   37.3 ml 14.68 ml/m  RA Volume:   24.00 ml  9.44 ml/m LA Vol (A4C):   43.2 ml 17.00 ml/m LA Biplane Vol: 41.2 ml 16.21 ml/m  AORTIC VALVE AV Area (Vmax):    4.72 cm AV Area (Vmean):   4.38 cm AV Area (VTI):     4.65 cm AV Vmax:           109.09 cm/s AV Vmean:          75.680 cm/s AV VTI:            0.253 m AV Peak Grad:      4.8 mmHg AV Mean Grad:      2.7 mmHg LVOT Vmax:         83.60 cm/s LVOT Vmean:        53.800 cm/s LVOT VTI:          0.191 m LVOT/AV VTI ratio: 0.75  AORTA Ao Root diam: 4.20 cm Ao Asc diam:  3.60 cm  MITRAL VALVE MV Area (PHT): 2.48 cm    SHUNTS MV Decel Time: 306 msec    Systemic VTI:  0.19 m MV E velocity: 55.10 cm/s  Systemic Diam: 2.80 cm MV A velocity: 72.40 cm/s MV E/A ratio:  0.76 Carlyle Dolly MD Electronically signed by Carlyle Dolly MD Signature Date/Time: 12/22/2021/3:41:47 PM    Final     Cardiac Studies   Echo: 12/22/21  IMPRESSIONS     1. Left ventricular ejection fraction, by estimation, is 60 to 65%. The  left ventricle has normal function. The left ventricle has no regional  wall motion abnormalities. There is mild left ventricular hypertrophy.  Left  ventricular diastolic parameters  are consistent with Grade I diastolic dysfunction (impaired relaxation).   2. Right ventricular systolic function was not well visualized. The right  ventricular size is not well visualized. Tricuspid regurgitation signal is  inadequate for assessing PA pressure.   3. The mitral valve is normal in structure. No evidence of mitral valve  regurgitation. No evidence of mitral stenosis.   4. The aortic valve is tricuspid. Aortic valve regurgitation is not  visualized. No aortic stenosis is present.   5. Aortic dilatation noted. There is mild dilatation of the aortic root,  measuring 42 mm. There is mild dilatation of the ascending aorta,  measuring 36 mm.   FINDINGS   Left Ventricle: Left ventricular ejection fraction, by estimation, is 60  to 65%. The left ventricle has normal function. The left ventricle has no  regional wall motion abnormalities. Definity contrast agent was given IV  to delineate the left ventricular   endocardial borders. The left ventricular internal cavity size was normal  in size. There is mild left ventricular hypertrophy. Left ventricular  diastolic parameters are consistent with Grade I diastolic dysfunction  (impaired relaxation). Normal left  ventricular filling pressure.   Right Ventricle: The right ventricular size is not well visualized. Right   vetricular wall thickness was not well visualized. Right ventricular  systolic function was not well visualized. Tricuspid regurgitation signal  is inadequate for assessing PA  pressure.   Left Atrium: Left atrial size was normal in size.   Right Atrium: Right atrial size was normal in size.   Pericardium: There is no evidence of pericardial effusion.   Mitral Valve: The mitral valve is normal in structure. No evidence of  mitral valve regurgitation. No evidence of mitral valve stenosis.   Tricuspid Valve: The tricuspid valve is not well visualized. Tricuspid  valve regurgitation is not demonstrated. No evidence of tricuspid  stenosis.   Aortic Valve: The aortic valve is tricuspid. Aortic valve regurgitation is  not visualized. No aortic stenosis is present. Aortic valve mean gradient  measures 2.7 mmHg. Aortic valve peak gradient measures 4.8 mmHg. Aortic  valve area, by VTI measures 4.65  cm.   Pulmonic Valve: The pulmonic valve was not well visualized. Pulmonic valve  regurgitation is not visualized. No evidence of pulmonic stenosis.   Aorta: Aortic dilatation noted. There is mild dilatation of the aortic  root, measuring 42 mm. There is mild dilatation of the ascending aorta,  measuring 36 mm.   Venous: The inferior vena cava was not well visualized.   IAS/Shunts: The interatrial septum was not well visualized.   Patient Profile     61 y.o. male with PMH of HTN, DM 2, HLD, thyroid abnormality who was transferred from outside hospital for the evaluation of abnormal stress test and potentially LHC.  Assessment & Plan    Abnormal Stress test Chest pain -- transferred from [facility] after abnormal stress test with moderate reversible defect in the m/d lateral and inferior wall consistent with inducible myocardial ischemia. Planned for cardiac cath today -- continue ASA, statin, metoprolol, amlodipine  HTN -- stable -- continue metoprolol and amlodipine, HCTZ  was stopped on admission   DM -- Hgb A1c 7.9 -- on Actos PTA, was on ozempic but now to expensive  -- continue SSI -- discussed starting back on metformin as first line med, along with potential SGLT2   Graves' Disease s/p ablation c/b Hypothyroidism -- TSH 106 on admission  -- outpatient synthroid dose suppose to  be 244mg daily (only on 1119m here) will adjust -- reports he misses doses at home to be able to take his pain medication, advised he will need outpatient follow up with PCP at follow up -- check T3, T4  GERD -- continue PPI  HLD -- continue atorvastatin '40mg'$  daily   Chronic pain -- continue home regimen (pharmD adjusting to match home medications)  For questions or updates, please contact [employer] consult [email or web address] for contact info under        Signed, LiReino BellisNP  12/23/2021, 7:48 AM     Patient seen and examined. Agree with assessment and plan.  Patient has experienced intermittent episodes of left-sided chest discomfort.  Scheduled for definitive cardiac catheterization today after an outpatient nuclear stress test raise concern for possible reversible defect in the mid lateral and inferior wall concerning for ischemia.  He has a history of remote Graves' disease status post ablation with subsequent hypothyroidism.  [facility] instead of his previous dose.  However TSH today is significantly elevated at 120 most likely is consistent with more long-term optimal L-thyroxine therapy.  I discussed need for improved treatment.  He has been followed by his primary physician and not endocrinology.  Endo evaluation may be helpful in the future.  Pressure today is stable on regimen consisting of morphine 5 mg and metoprolol 50 mg twice a day.  He is diabetic and remotely had been on Ozempic discontinued due to expense.  He may benefit from SGLT2 in  addition.I have reviewed the risks, indications, and alternatives to cardiac catheterization, possible angioplasty, and stenting with the patient. Risks include but are not limited to bleeding, infection, vascular injury, stroke, myocardial infection, arrhythmia, kidney injury, radiation-related injury in the case of prolonged fluoroscopy use, emergency cardiac surgery, and death. The patient understands the risks of serious complication is 1-2 in 104315ith diagnostic cardiac cath and 1-2% or less with angioplasty/stenting.  Plan cath today.   ThTroy SineMD, FABarstow Community Hospital0/23/2023 11:50 AM

## 2021-12-23 NOTE — Telephone Encounter (Signed)
Pharmacy Patient Advocate Encounter  Insurance verification completed.    The patient is insured through [employer] Part D   The patient is currently admitted and ran test claims for the following: Farxiga, Jardiance.  Copays and coinsurance results were relayed to Inpatient clinical team.

## 2021-12-23 NOTE — Progress Notes (Signed)
Mobility Specialist - Progress Note   12/23/21 1007  Mobility  Activity Ambulated independently in hallway  Level of Assistance Independent  Assistive Device None  Distance Ambulated (ft) 50 ft  Activity Response Tolerated well  Mobility Referral No  $Mobility charge 1 Mobility    During mobility: 56 HR  Pt received and nurse desk and agreeable. No complaints throughout. Pt returned to room with all needs met.  Larey Seat

## 2021-12-23 NOTE — Interval H&P Note (Signed)
History and Physical Interval Note:  12/23/2021 2:24 PM  Derek Francis Standing  has presented today for surgery, with the diagnosis of unstable angina.  The various methods of treatment have been discussed with the patient and family. After consideration of risks, benefits and other options for treatment, the patient has consented to  Procedure(s): LEFT HEART CATH AND CORONARY ANGIOGRAPHY (N/A) as a surgical intervention.  The patient's history has been reviewed, patient examined, no change in status, stable for surgery.  I have reviewed the patient's chart and labs.  Questions were answered to the patient's satisfaction.   Cath Lab Visit (complete for each Cath Lab visit)  Clinical Evaluation Leading to the Procedure:   ACS: Yes.    Non-ACS:    Anginal Classification: CCS III  Anti-ischemic medical therapy: Minimal Therapy (1 class of medications)  Non-Invasive Test Results: Intermediate-risk stress test findings: cardiac mortality 1-3%/year  Prior CABG: No previous CABG        Derek Francis [facility] 12/23/2021 2:25 PM

## 2021-12-23 NOTE — TOC Benefit Eligibility Note (Signed)
Patient Teacher, English as a foreign language completed.    The patient is currently admitted and upon discharge could be taking Jardiance 10 mg.  The current 30 day co-pay is $160.25 due to being in Coverage Gap (donut hole).   The patient is currently admitted and upon discharge could be taking Farxiga 10 mg.  The current 30 day co-pay is $160.25 due to being in Coverage Gap (donut hole).   The patient is insured through [place], [place] Patient Advocate Specialist [employer] Patient Advocate Team Direct Number: [phone]  Fax: [phone]

## 2021-12-24 ENCOUNTER — Telehealth: Payer: Self-pay | Admitting: General Practice

## 2021-12-24 ENCOUNTER — Other Ambulatory Visit (HOSPITAL_COMMUNITY): Payer: Self-pay

## 2021-12-24 ENCOUNTER — Encounter (HOSPITAL_COMMUNITY): Payer: Self-pay | Admitting: Cardiology

## 2021-12-24 DIAGNOSIS — I10 Essential (primary) hypertension: Secondary | ICD-10-CM

## 2021-12-24 DIAGNOSIS — R9439 Abnormal result of other cardiovascular function study: Secondary | ICD-10-CM | POA: Diagnosis not present

## 2021-12-24 DIAGNOSIS — E118 Type 2 diabetes mellitus with unspecified complications: Secondary | ICD-10-CM

## 2021-12-24 DIAGNOSIS — E785 Hyperlipidemia, unspecified: Secondary | ICD-10-CM

## 2021-12-24 DIAGNOSIS — E782 Mixed hyperlipidemia: Secondary | ICD-10-CM

## 2021-12-24 HISTORY — DX: Hyperlipidemia, unspecified: E78.5

## 2021-12-24 HISTORY — DX: Essential (primary) hypertension: I10

## 2021-12-24 HISTORY — DX: Type 2 diabetes mellitus with unspecified complications: E11.8

## 2021-12-24 LAB — BASIC METABOLIC PANEL
Anion gap: 7 (ref 5–15)
BUN: 16 mg/dL (ref 6–20)
CO2: 28 mmol/L (ref 22–32)
Calcium: 9.2 mg/dL (ref 8.9–10.3)
Chloride: 105 mmol/L (ref 98–111)
Creatinine, Ser: 1.31 mg/dL — ABNORMAL HIGH (ref 0.61–1.24)
GFR, Estimated: >60 mL/min (ref >60)
Glucose, Bld: 152 mg/dL — ABNORMAL HIGH (ref 70–99)
Potassium: 4.1 mmol/L (ref 3.5–5.1)
Sodium: 140 mmol/L (ref 135–145)

## 2021-12-24 LAB — GLUCOSE, CAPILLARY
Glucose-Capillary: 123 mg/dL — ABNORMAL HIGH (ref 70–99)
Glucose-Capillary: 147 mg/dL — ABNORMAL HIGH (ref 70–99)
Glucose-Capillary: 142 mg/dL — ABNORMAL HIGH (ref 70–99)

## 2021-12-24 LAB — LIPOPROTEIN A (LPA): Lipoprotein (a): 28.1 nmol/L (ref <75.0)

## 2021-12-24 LAB — T3, FREE: T3, Free: 1.7 pg/mL — ABNORMAL LOW (ref 2.0–4.4)

## 2021-12-24 MED ORDER — METFORMIN HCL 500 MG PO TABS
500.0000 mg | ORAL_TABLET | Freq: Two times a day (BID) | ORAL | 0 refills | Status: AC
  Filled 2021-12-24: qty 180, 90d supply, fill #0

## 2021-12-24 MED ORDER — AMLODIPINE BESYLATE 5 MG PO TABS
5.0000 mg | ORAL_TABLET | Freq: Every day | ORAL | 0 refills | Status: AC
  Filled 2021-12-24: qty 90, 90d supply, fill #0

## 2021-12-24 MED ORDER — METOPROLOL TARTRATE 50 MG PO TABS
50.0000 mg | ORAL_TABLET | Freq: Two times a day (BID) | ORAL | 0 refills | Status: AC
  Filled 2021-12-24: qty 180, 90d supply, fill #0

## 2021-12-24 MED ORDER — ATORVASTATIN CALCIUM 40 MG PO TABS
40.0000 mg | ORAL_TABLET | Freq: Every day | ORAL | 0 refills | Status: AC
  Filled 2021-12-24: qty 90, 90d supply, fill #0

## 2021-12-24 MED ORDER — "THROMBI-PAD 3""X3"" EX PADS"
1.0000 | MEDICATED_PAD | Freq: Once | CUTANEOUS | Status: AC
  Administered 2021-12-24: 1 via TOPICAL
  Filled 2021-12-24: qty 1

## 2021-12-24 NOTE — TOC Transition Note (Signed)
Three (3) discharge meds in the [place] on the second floor until patient discharges.

## 2021-12-24 NOTE — Telephone Encounter (Signed)
LVM for pt to call and schedule appt with cardiologist/kbl 12/24/21

## 2021-12-24 NOTE — Discharge Summary (Addendum)
Discharge Summary    Patient ID: Derek Francis MRN: [id number]; DOB: 1960/10/01  Admit date: 12/21/2021 Discharge date: 12/24/2021  PCP:  Derek Francis, [place] Providers Cardiologist:  Derek Francis)  Discharge Diagnoses    Principal Problem:   Abnormal stress test Active Problems:   Hyperlipidemia   Hypertension   Type 2 diabetes mellitus with complication, without long-term current use of insulin [facility])  Diagnostic Studies/Procedures    Cath: 12/23/21    Mid LAD lesion is 30% stenosed.   LV end diastolic pressure is normal.   No significant obstructive CAD. There is mild bridging in the mid LAD. Nothing to correlate with Myoview findings Normal LVEDP   Plan: medical therapy.  Diagnostic Dominance: Left  Echo: 12/22/21  IMPRESSIONS     1. Left ventricular ejection fraction, by estimation, is 60 to 65%. The  left ventricle has normal function. The left ventricle has no regional  wall motion abnormalities. There is mild left ventricular hypertrophy.  Left ventricular diastolic parameters  are consistent with Grade I diastolic dysfunction (impaired relaxation).   2. Right ventricular systolic function was not well visualized. The right  ventricular size is not well visualized. Tricuspid regurgitation signal is  inadequate for assessing PA pressure.   3. The mitral valve is normal in structure. No evidence of mitral valve  regurgitation. No evidence of mitral stenosis.   4. The aortic valve is tricuspid. Aortic valve regurgitation is not  visualized. No aortic stenosis is present.   5. Aortic dilatation noted. There is mild dilatation of the aortic root,  measuring 42 mm. There is mild dilatation of the ascending aorta,  measuring 36 mm.   FINDINGS   Left Ventricle: Left ventricular ejection fraction, by estimation, is 60  to 65%. The left ventricle has normal function. The left ventricle has no  regional wall motion  abnormalities. Definity contrast agent was given IV  to delineate the left ventricular   endocardial borders. The left ventricular internal cavity size was normal  in size. There is mild left ventricular hypertrophy. Left ventricular  diastolic parameters are consistent with Grade I diastolic dysfunction  (impaired relaxation). Normal left  ventricular filling pressure.   Right Ventricle: The right ventricular size is not well visualized. Right  vetricular wall thickness was not well visualized. Right ventricular  systolic function was not well visualized. Tricuspid regurgitation signal  is inadequate for assessing PA  pressure.   Left Atrium: Left atrial size was normal in size.   Right Atrium: Right atrial size was normal in size.   Pericardium: There is no evidence of pericardial effusion.   Mitral Valve: The mitral valve is normal in structure. No evidence of  mitral valve regurgitation. No evidence of mitral valve stenosis.   Tricuspid Valve: The tricuspid valve is not well visualized. Tricuspid  valve regurgitation is not demonstrated. No evidence of tricuspid  stenosis.   Aortic Valve: The aortic valve is tricuspid. Aortic valve regurgitation is  not visualized. No aortic stenosis is present. Aortic valve mean gradient  measures 2.7 mmHg. Aortic valve peak gradient measures 4.8 mmHg. Aortic  valve area, by VTI measures 4.65  cm.   Pulmonic Valve: The pulmonic valve was not well visualized. Pulmonic valve  regurgitation is not visualized. No evidence of pulmonic stenosis.   Aorta: Aortic dilatation noted. There is mild dilatation of the aortic  root, measuring 42 mm. There is mild dilatation of the ascending aorta,  measuring 36 mm.  Venous: The inferior vena cava was not well visualized.   IAS/Shunts: The interatrial septum was not well visualized.  _____________   History of Present Illness     Derek Francis is a 61 y.o. male with a PMHx of HTN, IBS/GERD,  T2DM, obesity, hypothyroidism/Graves' disease, chronic pain, hyperlipidemia, and OSA who was seen 12/21/2021 for the evaluation of abnormal stress test.  Derek Francis reported one episode of left chest tightness/sharp pain (8/10 on severity) the Tuesday prior to admission while he was sleeping. The pain radiated to his left shoulder.  He also reported associated symptoms including nausea and shortness of breath.The pain resolved with SL nitroglycerin.  He had another similar episode this past Wednesday and he presented at [facility] 10/18.  He was bothered recently by the death of his nephew who died suddenly of a heart attack. At OSH, his troponinx2 were negative with positive D-dimer(CT findings for pulmonary embolism).  EKG reviewed sinus bradycardia.  His Lovenox was discontinued since he was chest pain since admission.  TTE showed LVEF 60 to 65% without significant valvular stenosis or regurgitation.  His MPS reported moderate reversible defect in the mid and the distal lateral and inferior worse, consistent with inducible myocardial ischemia.  He was transferred here for further evaluation. Continued his chest pain-free, on room air, no acute distress.    He stated his last heart catheterization was about 2 years ago which was fairly unremarkable at a [facility].  He had another unremarkable heart catheterization approximately 7 years ago   Denies tobacco use.  Reports family history of coronary artery disease.   Hospital Course     Abnormal Stress test Chest pain -- transferred from [facility] after abnormal stress test with moderate reversible defect in the m/d lateral and inferior wall consistent with inducible myocardial ischemia. Underwent cardiac cath noted above with mild 30% mLAD with mild bridging in the mLAD but no culprit lesion to correlate with myoview findings. Normal LVEDP -- continue ASA, statin, metoprolol, amlodipine   HTN -- stable -- continue metoprolol and  amlodipine, HCTZ was stopped on admission    DM -- Hgb A1c 7.9 -- on Actos PTA, was on ozempic but now to expensive  -- treated with SSI while inpatient -- discussed starting back on metformin as first line med which he was agreeable to   Graves' Disease s/p ablation c/b Hypothyroidism -- TSH 106 on admission  -- outpatient synthroid dose suppose to be 218mg daily, reports missing doses as an outpatient -- low T3, T4 -- instructed to follow up with PCP as an outpatient   GERD -- continue PPI   HLD -- continue atorvastatin '40mg'$  daily  -- will need FLP/LFTs in 8 weeks    Chronic pain -- continue home regimen   General: Well developed, well nourished, male appearing in no acute distress. Head: Normocephalic, atraumatic.  Neck: Supple without bruits, JVD. Lungs:  Resp regular and unlabored, CTA. Heart: RRR, S1, S2, no S3, S4, or murmur; no rub. Abdomen: Soft, non-tender, non-distended with normoactive bowel sounds. No hepatomegaly. No rebound/guarding. No obvious abdominal masses. Extremities: No clubbing, cyanosis, edema. Distal pedal pulses are 2+ bilaterally. Right radial cath site stable without bruising or hematoma Neuro: Alert and oriented X 3. Moves all extremities spontaneously. Psych: Normal affect.  Patient was seen by  Dr. KClaiborne Billingsand deemed stable for discharge home. Follow up arranged in the office. Medications sent to [facility] by pharmD.   Did the patient have  an acute coronary syndrome (MI, NSTEMI, STEMI, etc) this admission?:  No                               Did the patient have a percutaneous coronary intervention (stent / angioplasty)?:  No.   _____________  Discharge Vitals Blood pressure 112/87, pulse 62, temperature 97.7 F (36.5 C), temperature source Oral, resp. rate 18, height '6\' 2"'$  (1.88 m), weight 131.1 kg, SpO2 95 %.  Filed Weights   12/21/21 1725  Weight: 131.1 kg    Labs & Radiologic Studies    CBC Recent Labs     12/22/21 0138  WBC 7.9  HGB 15.0  HCT 44.1  MCV 93.2  PLT 811   Basic Metabolic Panel Recent Labs    12/22/21 0138 12/24/21 0239  NA 141 140  K 3.6 4.1  CL 99 105  CO2 31 28  GLUCOSE 152* 152*  BUN 17 16  CREATININE 1.24 1.31*  CALCIUM 9.8 9.2   Liver Function Tests No results for input(s): "AST", "ALT", "ALKPHOS", "BILITOT", "PROT", "ALBUMIN" in the last 72 hours. No results for input(s): "LIPASE", "AMYLASE" in the last 72 hours. High Sensitivity Troponin:   No results for input(s): "TROPONINIHS" in the last 720 hours.  BNP Invalid input(s): "POCBNP" D-Dimer No results for input(s): "DDIMER" in the last 72 hours. Hemoglobin A1C Recent Labs    12/22/21 0138  HGBA1C 7.9*   Fasting Lipid Panel Recent Labs    12/22/21 0138  CHOL 166  HDL 46  LDLCALC 99  TRIG 106  CHOLHDL 3.6   Thyroid Function Tests Recent Labs    12/22/21 0138 12/23/21 0855  TSH 106.949*  --   T3FREE  --  1.7*   _____________  DG Chest 2 View  Result Date: 12/23/2021 CLINICAL DATA:  Hypoxia EXAM: CHEST - 2 VIEW COMPARISON:  12/18/2021 FINDINGS: Frontal and lateral views of the chest demonstrate an unremarkable cardiac silhouette. No acute airspace disease, effusion, or pneumothorax. No acute bony abnormalities. IMPRESSION: 1. No acute intrathoracic process. Electronically Signed   By: Randa Ngo M.D.   On: 12/23/2021 21:29   CARDIAC CATHETERIZATION  Result Date: 12/23/2021   Mid LAD lesion is 30% stenosed.   LV end diastolic pressure is normal. No significant obstructive CAD. There is mild bridging in the mid LAD. Nothing to correlate with Myoview findings Normal LVEDP Plan: medical therapy.   ECHOCARDIOGRAM COMPLETE  Result Date: 12/22/2021    ECHOCARDIOGRAM REPORT   Patient Name:   Derek Francis Date of Exam: 12/22/2021 Medical Rec #:  [id number]       Height:       74.0 in Accession #:    [id number]      Weight:       289.0 lb Date of Birth:  [birth year]       BSA:           2.542 m Patient Age:    36 years        BP:           103/85 mmHg Patient Gender: M               HR:           59 bpm. Exam Location:  Inpatient Procedure: 2D Echo, Color Doppler, Cardiac Doppler and Intracardiac            Opacification Agent Indications:    R07.9* Chest  pain, unspecified  History:        Patient has no prior history of Echocardiogram examinations.                 Signs/Symptoms:Chest Pain. Abnormal stress test.  Sonographer:    Roseanna Rainbow RDCS Referring Phys: [id number] FAN YE  Sonographer Comments: Technically difficult study due to poor echo windows, suboptimal parasternal window, suboptimal apical window, no subcostal window and patient is obese. Image acquisition challenging due to patient body habitus. IMPRESSIONS  1. Left ventricular ejection fraction, by estimation, is 60 to 65%. The left ventricle has normal function. The left ventricle has no regional wall motion abnormalities. There is mild left ventricular hypertrophy. Left ventricular diastolic parameters are consistent with Grade I diastolic dysfunction (impaired relaxation).  2. Right ventricular systolic function was not well visualized. The right ventricular size is not well visualized. Tricuspid regurgitation signal is inadequate for assessing PA pressure.  3. The mitral valve is normal in structure. No evidence of mitral valve regurgitation. No evidence of mitral stenosis.  4. The aortic valve is tricuspid. Aortic valve regurgitation is not visualized. No aortic stenosis is present.  5. Aortic dilatation noted. There is mild dilatation of the aortic root, measuring 42 mm. There is mild dilatation of the ascending aorta, measuring 36 mm. FINDINGS  Left Ventricle: Left ventricular ejection fraction, by estimation, is 60 to 65%. The left ventricle has normal function. The left ventricle has no regional wall motion abnormalities. Definity contrast agent was given IV to delineate the left ventricular  endocardial borders. The left  ventricular internal cavity size was normal in size. There is mild left ventricular hypertrophy. Left ventricular diastolic parameters are consistent with Grade I diastolic dysfunction (impaired relaxation). Normal left ventricular filling pressure. Right Ventricle: The right ventricular size is not well visualized. Right vetricular wall thickness was not well visualized. Right ventricular systolic function was not well visualized. Tricuspid regurgitation signal is inadequate for assessing PA pressure. Left Atrium: Left atrial size was normal in size. Right Atrium: Right atrial size was normal in size. Pericardium: There is no evidence of pericardial effusion. Mitral Valve: The mitral valve is normal in structure. No evidence of mitral valve regurgitation. No evidence of mitral valve stenosis. Tricuspid Valve: The tricuspid valve is not well visualized. Tricuspid valve regurgitation is not demonstrated. No evidence of tricuspid stenosis. Aortic Valve: The aortic valve is tricuspid. Aortic valve regurgitation is not visualized. No aortic stenosis is present. Aortic valve mean gradient measures 2.7 mmHg. Aortic valve peak gradient measures 4.8 mmHg. Aortic valve area, by VTI measures 4.65 cm. Pulmonic Valve: The pulmonic valve was not well visualized. Pulmonic valve regurgitation is not visualized. No evidence of pulmonic stenosis. Aorta: Aortic dilatation noted. There is mild dilatation of the aortic root, measuring 42 mm. There is mild dilatation of the ascending aorta, measuring 36 mm. Venous: The inferior vena cava was not well visualized. IAS/Shunts: The interatrial septum was not well visualized.  LEFT VENTRICLE PLAX 2D LVIDd:         4.90 cm      Diastology LVIDs:         3.10 cm      LV e' medial:    4.57 cm/s LV PW:         1.10 cm      LV E/e' medial:  12.1 LV IVS:        1.10 cm      LV e' lateral:   9.46 cm/s LVOT diam:  2.80 cm      LV E/e' lateral: 5.8 LV SV:         118 LV SV Index:   46 LVOT Area:      6.16 cm  LV Volumes (MOD) LV vol d, MOD A2C: 76.6 ml LV vol d, MOD A4C: 108.0 ml LV vol s, MOD A2C: 35.6 ml LV vol s, MOD A4C: 42.3 ml LV SV MOD A2C:     41.0 ml LV SV MOD A4C:     108.0 ml LV SV MOD BP:      53.4 ml RIGHT VENTRICLE RV S prime:     10.10 cm/s TAPSE (M-mode): 1.5 cm LEFT ATRIUM             Index        RIGHT ATRIUM           Index LA diam:        4.20 cm 1.65 cm/m   RA Area:     11.80 cm LA Vol (A2C):   37.3 ml 14.68 ml/m  RA Volume:   24.00 ml  9.44 ml/m LA Vol (A4C):   43.2 ml 17.00 ml/m LA Biplane Vol: 41.2 ml 16.21 ml/m  AORTIC VALVE AV Area (Vmax):    4.72 cm AV Area (Vmean):   4.38 cm AV Area (VTI):     4.65 cm AV Vmax:           109.09 cm/s AV Vmean:          75.680 cm/s AV VTI:            0.253 m AV Peak Grad:      4.8 mmHg AV Mean Grad:      2.7 mmHg LVOT Vmax:         83.60 cm/s LVOT Vmean:        53.800 cm/s LVOT VTI:          0.191 m LVOT/AV VTI ratio: 0.75  AORTA Ao Root diam: 4.20 cm Ao Asc diam:  3.60 cm MITRAL VALVE MV Area (PHT): 2.48 cm    SHUNTS MV Decel Time: 306 msec    Systemic VTI:  0.19 m MV E velocity: 55.10 cm/s  Systemic Diam: 2.80 cm MV A velocity: 72.40 cm/s MV E/A ratio:  0.76 Carlyle Dolly MD Electronically signed by Carlyle Dolly MD Signature Date/Time: 12/22/2021/3:41:47 PM    Final    Disposition   Pt is being discharged home today in good condition.  Follow-up Plans & Appointments     Follow-up Information     Richardo Priest, MD Follow up.   Specialties: Cardiology, Radiology Why: The office will call you with a follow up appt Contact information: [place] [address] [phone]                Discharge Instructions     Call MD for:  persistant dizziness or light-headedness   Complete by: As directed    Call MD for:  redness, tenderness, or signs of infection (pain, swelling, redness, odor or green/yellow discharge around incision site)   Complete by: As directed    Diet - low sodium heart healthy    Complete by: As directed    Discharge instructions   Complete by: As directed    Radial Site Care Refer to this sheet in the next few weeks. These instructions provide you with information on caring for yourself after your procedure. Your caregiver may also give you more specific instructions.  Your treatment has been planned according to current medical practices, but problems sometimes occur. Call your caregiver if you have any problems or questions after your procedure. HOME CARE INSTRUCTIONS You may shower the day after the procedure. Remove the bandage (dressing) and gently wash the site with plain soap and water. Gently pat the site dry.  Do not apply powder or lotion to the site.  Do not submerge the affected site in water for 3 to 5 days.  Inspect the site at least twice daily.  Do not flex or bend the affected arm for 24 hours.  No lifting over 5 pounds (2.3 kg) for 5 days after your procedure.  Do not drive home if you are discharged the same day of the procedure. Have someone else drive you.  You may drive 24 hours after the procedure unless otherwise instructed by your caregiver.  What to expect: Any bruising will usually fade within 1 to 2 weeks.  Blood that collects in the tissue (hematoma) may be painful to the touch. It should usually decrease in size and tenderness within 1 to 2 weeks.  SEEK IMMEDIATE MEDICAL CARE IF: You have unusual pain at the radial site.  You have redness, warmth, swelling, or pain at the radial site.  You have drainage (other than a small amount of blood on the dressing).  You have chills.  You have a fever or persistent symptoms for more than 72 hours.  You have a fever and your symptoms suddenly get worse.  Your arm becomes pale, cool, tingly, or numb.  You have heavy bleeding from the site. Hold pressure on the site.   Increase activity slowly   Complete by: As directed         Discharge Medications   Allergies as of 12/24/2021        Reactions   Reglan [metoclopramide] Anxiety   Toradol [ketorolac Tromethamine] Anxiety        Medication List     STOP taking these medications    atenolol 100 MG tablet Commonly known as: TENORMIN   hydrochlorothiazide 25 MG tablet Commonly known as: HYDRODIURIL   pioglitazone 30 MG tablet Commonly known as: ACTOS   pravastatin 80 MG tablet Commonly known as: PRAVACHOL       TAKE these medications    amLODipine 5 MG tablet Commonly known as: NORVASC Take 1 tablet (5 mg total) by mouth daily.   aspirin EC 81 MG tablet Take 81 mg by mouth daily.   atorvastatin 40 MG tablet Commonly known as: LIPITOR Take 1 tablet (40 mg total) by mouth at bedtime.   famotidine 20 MG tablet Commonly known as: PEPCID Take 20 mg by mouth 2 (two) times daily.   gabapentin 300 MG capsule Commonly known as: NEURONTIN Take 600 mg by mouth at bedtime as needed (pain).   HYDROmorphone HCl 8 MG Tb24 Commonly known as: EXALGO Take 8 mg by mouth at bedtime as needed (breakthrough pain).   lactulose 10 GM/15ML solution Commonly known as: CHRONULAC Take 10-20 g by mouth daily as needed (constipation).   levothyroxine 112 MCG tablet Commonly known as: SYNTHROID Take 224 mcg by mouth daily before breakfast.   metFORMIN 500 MG tablet Commonly known as: GLUCOPHAGE Take 1 tablet (500 mg total) by mouth 2 (two) times daily with a meal.   methocarbamol 500 MG tablet Commonly known as: ROBAXIN Take 500 mg by mouth 2 (two) times daily.   metoprolol tartrate 50 MG tablet Commonly known as: LOPRESSOR  Take 1 tablet (50 mg total) by mouth 2 (two) times daily.   nitroGLYCERIN 0.4 MG SL tablet Commonly known as: NITROSTAT Place 0.4 mg under the tongue every 5 (five) minutes as needed for chest pain.   omeprazole 40 MG capsule Commonly known as: PRILOSEC Take 40 mg by mouth 2 (two) times daily.   oxyCODONE 15 MG immediate release tablet Commonly known as: ROXICODONE Take 15 mg by  mouth 4 (four) times daily.           Outstanding Labs/Studies   FLP/LFTs in 8 weeks   Duration of Discharge Encounter   Greater than 30 minutes including physician time.  Signed, Reino Bellis, NP 12/24/2021, 10:48 AM    Patient seen and examined. Agree with assessment and plan.  Cath data reviewed with patient.  No significant obstructive disease with 30% LAD stenosis.  No chest pain or shortness of breath presently.  Again discussed the importance of continued thyroid replacement and this patient status post Graves' disease with subsequent ablation and need for long-term levothyroxine replacement.  This has been followed by his primary MD.  If there is significant continued difficulty follow-up with endocrinology may be necessary.  Plan DC today with cardiology follow-up with Dr. Shirlee More.   Troy Sine, MD, [facility] 12/24/2021 10:48 AM

## 2021-12-24 NOTE — Telephone Encounter (Signed)
-----   Message from Lindsay B Roberts, NP sent at 12/24/2021 10:20 AM EDT ----- Regarding: Follow up appt Please schedule this patient for a follow-up appointment and call them with that information.  Primary Cardiologist: (New) seen by moonlighter at [place] Date of Discharge: 12/24/2021 Appointment Needed Within: 4 weeks Appointment Type: hospital follow up  Thank you! Lindsay Roberts, NP-C   

## 2021-12-24 NOTE — TOC Transition Note (Signed)
Three (3) discharge meds delivered to the patient at bedside.

## 2021-12-24 NOTE — Progress Notes (Signed)
Discharge instructions reviewed with pt and his wife. Hand out on radial care reviewed with pt and wife.  Copy of instructions given to pt/wife, scripts filled by [facility] pharmacy and delivered to pt's room. 1 more med having to be filled by [place] [facility] and be delivered or picked up.  Pt's radial cath site bleeding slowly, dsg changed x2 with pressure being held.   Unit RN has contacted cardiology and a thrombi dressing has been ordered to be placed before pt can be discharged.

## 2021-12-24 NOTE — Care Management Important Message (Signed)
Important Message  Patient Details  Name: Derek Francis MRN: [id number] Date of Birth: May 10, 1960   Medicare Important Message Given:  Yes     Shelda Altes 12/24/2021, 11:15 AM

## 2021-12-24 NOTE — Progress Notes (Signed)
Mobility Specialist - Progress Note   12/24/21 0919  Mobility  Activity Ambulated independently in hallway  Level of Assistance Independent  Assistive Device None  Distance Ambulated (ft) 470 ft  Activity Response Tolerated well  Mobility Referral Yes  $Mobility charge 1 Mobility   Pt received at door and agreeable. No complaints throughout. Pt returned to room with all needs met.  Larey Seat

## 2021-12-26 NOTE — Telephone Encounter (Signed)
-----   Message from Cheryln Manly, NP sent at 12/24/2021 10:20 AM EDT ----- Regarding: Follow up appt Please schedule this patient for a follow-up appointment and call them with that information.  Primary Cardiologist: (New) seen by moonlighter at [facility] Date of Discharge: 12/24/2021 Appointment Needed Within: 4 weeks Appointment Type: hospital follow up  Thank you! Reino Bellis, NP-C

## 2021-12-26 NOTE — Telephone Encounter (Signed)
LVM for pt to call and schedule appt with cardiologist/kbl 12/26/21

## 2022-01-02 ENCOUNTER — Telehealth: Payer: Self-pay

## 2022-01-02 NOTE — Telephone Encounter (Signed)
Patient has been called on three separate occasions and LVM to schedule. Closing note.

## 2022-01-02 NOTE — Telephone Encounter (Signed)
-----   Message from Georga Bora sent at 01/01/2022  2:10 PM EDT ----- Regarding: RE: Follow up appt Called patient to schedule- no vm set up to leave message. ----- Message ----- From: Truddie Hidden, RN Sent: 01/01/2022   1:34 PM EDT To: Ambrose Mantle Subject: FW: Follow up appt                              ----- Message ----- From: Cheryln Manly, NP Sent: 12/24/2021  10:21 AM EDT To: Rebeca Alert Ash/Hp Scheduling Subject: Follow up appt                                 Please schedule this patient for a follow-up appointment and call them with that information.  Primary Cardiologist: (New) seen by moonlighter at [facility] Date of Discharge: 12/24/2021 Appointment Needed Within: 4 weeks Appointment Type: hospital follow up  Thank you! Reino Bellis, NP-C

## 2022-04-17 DIAGNOSIS — J849 Interstitial pulmonary disease, unspecified: Secondary | ICD-10-CM

## 2022-04-17 HISTORY — DX: Interstitial pulmonary disease, unspecified: J84.9

## 2022-05-07 ENCOUNTER — Encounter: Payer: Self-pay | Admitting: *Deleted

## 2022-05-07 ENCOUNTER — Encounter: Payer: Self-pay | Admitting: Cardiology

## 2022-05-10 NOTE — Progress Notes (Signed)
Cardiology Office Note:    Date:  05/12/2022   ID:  Derek Francis, DOB [birth year], MRN [id number]  PCP:  Andria Meuse, FNP  Cardiologist:  Norman Herrlich, MD   Referring MD: Andria Meuse, FNP  ASSESSMENT:    1. Mild CAD   2. Primary hypertension   3. Mixed hyperlipidemia   4. Type 2 diabetes mellitus with complication, without long-term current use of insulin (HCC)    PLAN:    In order of problems listed above:  He has mild nonobstructive CAD he has intermittent angina and has shortness of breath likely anginal equivalent consistent with coronary microvascular disease he is on a good medical regimen including aspirin beta-blocker calcium channel blocker and lipid-lowering with high intensity statin.  If not for severe headaches with nitroglycerin and placed on oral nitrates I think he would benefit from a trial of ranolazine and suspect: Symptomatic improvement and asked him to get involved in a regular walking program.  Follow-up 6 to 8 weeks Stable hypertension continue current treatment including beta-blocker calcium channel blocker Continue his high intensity statin he has upcoming labs if his LDL remains greater than 70 I would add Zetia to his statin Managed by his PCP using GLP-1 agonist and metformin  Next appointment 6 to 8 weeks   Medication Adjustments/Labs and Tests Ordered: Current medicines are reviewed at length with the patient today.  Concerns regarding medicines are outlined above.  No orders of the defined types were placed in this encounter.  No orders of the defined types were placed in this encounter.    He had CT of the chest for lung cancer screening showing severe coronary calcification but also has a lung mass hypermetabolic on PET scanning  History of Present Illness:    Derek Francis is a 62 y.o. male who is being seen today for the evaluation of nonobstructive CAD at the request of Andria Meuse, FNP.  He was seen with his PCP  in the virtual visit 04/03/2022 with a history of hypertension hyperlipidemia hypothyroidism polyarthritis type 2 diabetes and obesity.  A left heart catheterization performed 12/23/2021 showing very mild nonobstructive CAD involving the mid LAD 30% as well as coronary myocardial bridging with normal left ventricular filling pressure.  He had a myocardial perfusion study abnormal preceding coronary angiography.  Echocardiogram showed normal left ventricular size mild concentric LVH EF 60 to 65% and mild enlargement the aortic root 42 mm.  Is a little frustrated he does not feel better than he expected And frequently has nonexertional anginal discomfort pressure in his chest radiates to left neck left arm he is relieved with nitroglycerin but has a severe headache afterwards Tolerates lipid-lowering therapy without muscle pain or weakness in his home blood pressure runs 1 20-1 30 systolic His biggest complaint is that he has exercise intolerance he is short of breath with physical effort he tries to do heavy activities gardening work using his arms He also has chronic edema left lower extremity following trauma and previous DVT He is not having cough or wheezing no orthopnea palpitation or syncope He tells me he is due to have labs with his PCP to follow his lipids, profile 12/22/2021 cholesterol 166 LDL 99 A1c 7.9%    Past Medical History:  Diagnosis Date   Abnormal stress test 12/22/2021   Benign neoplasm of sigmoid colon 09/06/2014   Formatting of this note might be different from the original. Advanced adenoma with 1 year recall 03/2013 Normal 09/2014  Repeat colonoscopy 08/2017 ([facility])   Chest pain 01/27/2017   Formatting of this note might be different from the original. - cath 09/04/2020 normal coros. LV 55, left dom. Rt radial access   Chronic pain 09/04/2020   Class 2 severe obesity due to excess calories with serious comorbidity and body mass index (BMI) of 38.0 to 38.9 in adult  [facility]) 01/27/2017   Elevated troponin 09/04/2020   Formatting of this note might be different from the original. - cath 2020 1. Left Main - Normal 2. Left anterior descending artery - There is a 20% mid LAD stenosis.  The distal LAD is diffusely small.  There are 2 diagonal arteries which a re moderate sized and large 3. Left Circumflex - Dominant large normal.  There are 2 OM arteries which are normal 4. 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) 03/05/2011   Pain in both testicles 12/29/2018   Peyronie's disease 12/31/2018   Rectus diastasis 07/07/2019   Type 2 diabetes mellitus with complication, without long-term current use of insulin (HCC) 12/24/2021    Past Surgical History:  Procedure Laterality Date   ANKLE SURGERY     CHOLECYSTECTOMY  09/01/2013   KIDNEY STONE SURGERY     LEFT HEART CATH AND CORONARY ANGIOGRAPHY N/A 12/23/2021   Procedure: LEFT HEART CATH AND CORONARY ANGIOGRAPHY;  Surgeon: [place], Peter M, MD;  Location: [facility];  Service: Cardiovascular;  Laterality: N/A;   TOTAL KNEE ARTHROPLASTY Left 2003    Current Medications: Current Meds  Medication Sig   albuterol (VENTOLIN HFA) 108 (90 Base) MCG/ACT inhaler Inhale 2 puffs into the lungs every 6 (six) hours as needed for wheezing or shortness of breath.   amLODipine (NORVASC) 5 MG tablet Take 1 tablet (5 mg total) by mouth daily.   aspirin EC 81 MG tablet Take 81 mg by mouth daily.   atorvastatin (LIPITOR) 40 MG tablet Take 1 tablet (40 mg total) by mouth at bedtime.   dicyclomine (BENTYL) 10 MG capsule Take 10 mg by mouth every 6 (six) hours as needed for spasms.    famotidine (PEPCID) 20 MG tablet Take 20 mg by mouth 2 (two) times daily.   gabapentin (NEURONTIN) 300 MG capsule Take 600 mg by mouth at bedtime as needed (pain).   HYDROmorphone HCl (EXALGO) 8 MG TB24 Take 8 mg by mouth at bedtime as needed (breakthrough pain).   lactulose (CHRONULAC) 10 GM/15ML solution Take 10-20 g by mouth daily as needed (constipation).   levothyroxine (SYNTHROID) 112 MCG tablet Take 224 mcg by mouth daily before breakfast. For 6 days and then 1 tablet one day a week   metFORMIN (GLUCOPHAGE) 500 MG tablet Take 1 tablet (500 mg total) by mouth 2 (two) times daily with a meal.   methocarbamol (ROBAXIN) 500 MG tablet Take 500 mg by mouth 2 (two) times daily.   metoprolol tartrate (LOPRESSOR) 50 MG tablet Take 1 tablet (50 mg total) by mouth 2 (two) times daily.   nitroGLYCERIN (NITROSTAT) 0.4 MG SL tablet Place 0.4 mg under the tongue every 5 (five) minutes as needed for chest pain.   omeprazole (PRILOSEC) 40 MG capsule Take 40 mg by  mouth 2 (two) times daily.   oxyCODONE (ROXICODONE) 15 MG immediate release tablet Take 15 mg by mouth daily as needed for pain.   promethazine (PHENERGAN) 25 MG tablet Take 25 mg by mouth every 6 (six) hours as needed for nausea or vomiting.   Semaglutide,0.25 or 0.5MG /DOS, (OZEMPIC, 0.25 OR 0.5 MG/DOSE,) 2 MG/3ML SOPN Inject 0.5 mg into the skin once a week.     Allergies:   Reglan [metoclopramide] and Toradol [ketorolac tromethamine]   Social History   Socioeconomic History   Marital status: Married    Spouse name: Not on file   Number of children: Not on file   Years of education: Not on file   Highest education level: Not on file  Occupational History   Not on file  Tobacco Use   Smoking status: Former    Years: 32.00    Types: Cigarettes    Quit date: 01/01/2009    Years since quitting: 13.3    Passive exposure: Past   Smokeless tobacco: Never  Substance and Sexual Activity   Alcohol use: Not on file   Drug use: Not on  file   Sexual activity: Not on file  Other Topics Concern   Not on file  Social History Narrative   Not on file   Social Determinants of Health   Financial Resource Strain: Not on file  Food Insecurity: Not on file  Transportation Needs: Not on file  Physical Activity: Not on file  Stress: Not on file  Social Connections: Not on file     Family History: The patient's family history includes COPD in his mother; Cancer in his father; Dementia in his mother; Diabetes in his mother; Hypertension in his brother; Kidney disease in his father.  ROS:   ROS Please see the history of present illness.     All other systems reviewed and are negative.  EKGs/Labs/Other Studies Reviewed:    The following studies were reviewed today:   Cardiac Studies & Procedures   CARDIAC CATHETERIZATION  CARDIAC CATHETERIZATION 12/23/2021  Narrative   Mid LAD lesion is 30% stenosed.   LV end diastolic pressure is normal.  No significant obstructive CAD. There is mild bridging in the mid LAD. Nothing to correlate with Myoview findings Normal LVEDP  Plan: medical therapy.  Findings Coronary Findings Diagnostic  Dominance: Left  Left Main Vessel was injected. Vessel is normal in caliber. Vessel is angiographically normal.  Left Anterior Descending Mid LAD lesion is 30% stenosed. There is mild bridging in the mid LAD  Left Circumflex Vessel was injected. Vessel is large. Vessel is angiographically normal.  Right Coronary Artery Vessel was injected. Vessel is small. Vessel is angiographically normal.  Intervention  No interventions have been documented.     ECHOCARDIOGRAM  ECHOCARDIOGRAM COMPLETE 12/22/2021  Narrative ECHOCARDIOGRAM REPORT    Patient Name:   Derek Francis Date of Exam: 12/22/2021 Medical Rec #:  [id number]       Height:       74.0 in Accession #:    [id number]      Weight:       289.0 lb Date of Birth:  [birth year]       BSA:          2.542 m Patient Age:     61 years        BP:           103/85 mmHg Patient Gender: M  HR:           59 bpm. Exam Location:  Inpatient  Procedure: 2D Echo, Color Doppler, Cardiac Doppler and Intracardiac Opacification Agent  Indications:    R07.9* Chest pain, unspecified  History:        Patient has no prior history of Echocardiogram examinations. Signs/Symptoms:Chest Pain. Abnormal stress test.  Sonographer:    Sheralyn Boatman RDCS Referring Phys: [id number] FAN YE   Sonographer Comments: Technically difficult study due to poor echo windows, suboptimal parasternal window, suboptimal apical window, no subcostal window and patient is obese. Image acquisition challenging due to patient body habitus. IMPRESSIONS   1. Left ventricular ejection fraction, by estimation, is 60 to 65%. The left ventricle has normal function. The left ventricle has no regional wall motion abnormalities. There is mild left ventricular hypertrophy. Left ventricular diastolic parameters are consistent with Grade I diastolic dysfunction (impaired relaxation). 2. Right ventricular systolic function was not well visualized. The right ventricular size is not well visualized. Tricuspid regurgitation signal is inadequate for assessing PA pressure. 3. The mitral valve is normal in structure. No evidence of mitral valve regurgitation. No evidence of mitral stenosis. 4. The aortic valve is tricuspid. Aortic valve regurgitation is not visualized. No aortic stenosis is present. 5. Aortic dilatation noted. There is mild dilatation of the aortic root, measuring 42 mm. There is mild dilatation of the ascending aorta, measuring 36 mm.  FINDINGS Left Ventricle: Left ventricular ejection fraction, by estimation, is 60 to 65%. The left ventricle has normal function. The left ventricle has no regional wall motion abnormalities. Definity contrast agent was given IV to delineate the left ventricular endocardial borders. The left ventricular internal cavity  size was normal in size. There is mild left ventricular hypertrophy. Left ventricular diastolic parameters are consistent with Grade I diastolic dysfunction (impaired relaxation). Normal left ventricular filling pressure.  Right Ventricle: The right ventricular size is not well visualized. Right vetricular wall thickness was not well visualized. Right ventricular systolic function was not well visualized. Tricuspid regurgitation signal is inadequate for assessing PA pressure.  Left Atrium: Left atrial size was normal in size.  Right Atrium: Right atrial size was normal in size.  Pericardium: There is no evidence of pericardial effusion.  Mitral Valve: The mitral valve is normal in structure. No evidence of mitral valve regurgitation. No evidence of mitral valve stenosis.  Tricuspid Valve: The tricuspid valve is not well visualized. Tricuspid valve regurgitation is not demonstrated. No evidence of tricuspid stenosis.  Aortic Valve: The aortic valve is tricuspid. Aortic valve regurgitation is not visualized. No aortic stenosis is present. Aortic valve mean gradient measures 2.7 mmHg. Aortic valve peak gradient measures 4.8 mmHg. Aortic valve area, by VTI measures 4.65 cm.  Pulmonic Valve: The pulmonic valve was not well visualized. Pulmonic valve regurgitation is not visualized. No evidence of pulmonic stenosis.  Aorta: Aortic dilatation noted. There is mild dilatation of the aortic root, measuring 42 mm. There is mild dilatation of the ascending aorta, measuring 36 mm.  Venous: The inferior vena cava was not well visualized.  IAS/Shunts: The interatrial septum was not well visualized.   LEFT VENTRICLE PLAX 2D LVIDd:         4.90 cm      Diastology LVIDs:         3.10 cm      LV e' medial:    4.57 cm/s LV PW:         1.10 cm      LV  E/e' medial:  12.1 LV IVS:        1.10 cm      LV e' lateral:   9.46 cm/s LVOT diam:     2.80 cm      LV E/e' lateral: 5.8 LV SV:         118 LV SV  Index:   46 LVOT Area:     6.16 cm  LV Volumes (MOD) LV vol d, MOD A2C: 76.6 ml LV vol d, MOD A4C: 108.0 ml LV vol s, MOD A2C: 35.6 ml LV vol s, MOD A4C: 42.3 ml LV SV MOD A2C:     41.0 ml LV SV MOD A4C:     108.0 ml LV SV MOD BP:      53.4 ml  RIGHT VENTRICLE RV S prime:     10.10 cm/s TAPSE (M-mode): 1.5 cm  LEFT ATRIUM             Index        RIGHT ATRIUM           Index LA diam:        4.20 cm 1.65 cm/m   RA Area:     11.80 cm LA Vol (A2C):   37.3 ml 14.68 ml/m  RA Volume:   24.00 ml  9.44 ml/m LA Vol (A4C):   43.2 ml 17.00 ml/m LA Biplane Vol: 41.2 ml 16.21 ml/m AORTIC VALVE AV Area (Vmax):    4.72 cm AV Area (Vmean):   4.38 cm AV Area (VTI):     4.65 cm AV Vmax:           109.09 cm/s AV Vmean:          75.680 cm/s AV VTI:            0.253 m AV Peak Grad:      4.8 mmHg AV Mean Grad:      2.7 mmHg LVOT Vmax:         83.60 cm/s LVOT Vmean:        53.800 cm/s LVOT VTI:          0.191 m LVOT/AV VTI ratio: 0.75  AORTA Ao Root diam: 4.20 cm Ao Asc diam:  3.60 cm  MITRAL VALVE MV Area (PHT): 2.48 cm    SHUNTS MV Decel Time: 306 msec    Systemic VTI:  0.19 m MV E velocity: 55.10 cm/s  Systemic Diam: 2.80 cm MV A velocity: 72.40 cm/s MV E/A ratio:  0.76  Dina Rich MD Electronically signed by Dina Rich MD Signature Date/Time: 12/22/2021/3:41:47 PM    Final              Recent Labs: 12/22/2021: Hemoglobin 15.0; Platelets 232; TSH 106.949 12/24/2021: BUN 16; Creatinine, Ser 1.31; Potassium 4.1; Sodium 140  Recent Lipid Panel    Component Value Date/Time   CHOL 166 12/22/2021 0138   TRIG 106 12/22/2021 0138   HDL 46 12/22/2021 0138   CHOLHDL 3.6 12/22/2021 0138   VLDL 21 12/22/2021 0138   LDLCALC 99 12/22/2021 0138    Physical Exam:    VS:  BP 112/78 (BP Location: Right Arm, Patient Position: Sitting)   Pulse 72   Ht 6\' 2"  (1.88 m)   Wt 284 lb 6.4 oz (129 kg)   SpO2 97%   BMI 36.51 kg/m     Wt Readings from Last 3  Encounters:  05/12/22 284 lb 6.4 oz (129 kg)  12/21/21 289 lb (131.1 kg)  GEN:  Well nourished, well developed in no acute distress HEENT: Normal NECK: No JVD; No carotid bruits LYMPHATICS: No lymphadenopathy CARDIAC: RRR, no murmurs, rubs, gallops RESPIRATORY:  Clear to auscultation without rales, wheezing or rhonchi  ABDOMEN: Soft, non-tender, non-distended MUSCULOSKELETAL:  No edema; No deformity  SKIN: Warm and dry NEUROLOGIC:  Alert and oriented x 3 PSYCHIATRIC:  Normal affect     Signed, Norman Herrlich, MD  05/12/2022 10:41 AM    [facility]

## 2022-05-12 ENCOUNTER — Encounter: Payer: Self-pay | Admitting: Cardiology

## 2022-05-12 ENCOUNTER — Ambulatory Visit: Payer: Medicare (Managed Care) | Attending: Cardiology | Admitting: Cardiology

## 2022-05-12 VITALS — BP 112/78 | HR 72 | Ht 74.0 in | Wt 284.4 lb

## 2022-05-12 DIAGNOSIS — I10 Essential (primary) hypertension: Secondary | ICD-10-CM | POA: Diagnosis not present

## 2022-05-12 DIAGNOSIS — E782 Mixed hyperlipidemia: Secondary | ICD-10-CM | POA: Diagnosis not present

## 2022-05-12 DIAGNOSIS — E118 Type 2 diabetes mellitus with unspecified complications: Secondary | ICD-10-CM

## 2022-05-12 DIAGNOSIS — I2510 Atherosclerotic heart disease of native coronary artery without angina pectoris: Secondary | ICD-10-CM | POA: Diagnosis not present

## 2022-05-12 MED ORDER — RANOLAZINE ER 500 MG PO TB12: 1000.0000 mg | ORAL_TABLET | Freq: Two times a day (BID) | ORAL | 3 refills | Status: DC

## 2022-05-12 NOTE — Patient Instructions (Signed)
Medication Instructions:  Your physician has recommended you make the following change in your medication:   START: Ranexa 1000 mg twice daily (Take 500 mg twice daily = 1 tablet twice daily for 2 weeks, then take 1000 mg twice daily = 2 tablets twice daily)  *If you need a refill on your cardiac medications before your next appointment, please call your pharmacy*   Lab Work: None If you have labs (blood work) drawn today and your tests are completely normal, you will receive your results only by: [place] (if you have MyChart) OR A paper copy in the mail If you have any lab test that is abnormal or we need to change your treatment, we will call you to review the results.   Testing/Procedures: None   Follow-Up: At [facility], you and your health needs are our priority.  As part of our continuing mission to provide you with exceptional heart care, we have created designated Provider Care Teams.  These Care Teams include your primary Cardiologist (physician) and Advanced Practice Providers (APPs -  Physician Assistants and Nurse Practitioners) who all work together to provide you with the care you need, when you need it.  We recommend signing up for the patient portal called "MyChart".  Sign up information is provided on this After Visit Summary.  MyChart is used to connect with patients for Virtual Visits (Telemedicine).  Patients are able to view lab/test results, encounter notes, upcoming appointments, etc.  Non-urgent messages can be sent to your provider as well.   To learn more about what you can do with MyChart, go to [email or web address].    Your next appointment:   8 week(s)  Provider:   Venia Carbon, NP Tia Alert)    Other Instructions Nurse visit for EKG in 2 weeks

## 2022-05-13 ENCOUNTER — Telehealth: Payer: Self-pay

## 2022-05-13 NOTE — Telephone Encounter (Signed)
Patient informed of PA was approved for the Ranolazine and would be able to pickup at his pharmacy.  Patient voices understanding and agreed  Zee Vill (Key: [id number]) PA Case ID #: [id number] Rx #: [id number] Need Help? Call us at [phone] Outcome Approved today CaseId:[id number];Status:Approved;Review Type:Prior Auth;Coverage Start Date:04/13/2022;Coverage End Date:05/13/2023; Authorization Expiration Date: 05/12/2023

## 2022-05-13 NOTE — Telephone Encounter (Signed)
Derek Francis (Key: [id number]) PA Case ID #: [id number] Rx #: [id number] Call us at [phone] Status sent ito Plan today Drug Ranolazine ER '500MG'$  er tablets

## 2022-05-15 ENCOUNTER — Telehealth: Payer: Self-pay | Admitting: Cardiology

## 2022-05-15 NOTE — Telephone Encounter (Signed)
Patient unable to obtain Ranexa due to financial hardship.  Please call [phone]

## 2022-05-15 NOTE — Telephone Encounter (Signed)
Called patient regarding the his cost for the Ranexa. He states that he can not afford the over $100 cost. I informed patient of Ranexa Connect patient assistance program and he agrees to try and will come by office to pick up application.

## 2022-05-26 ENCOUNTER — Ambulatory Visit: Payer: Medicare (Managed Care) | Attending: Cardiology

## 2022-05-26 VITALS — BP 110/80 | HR 66 | Resp 18 | Ht 74.0 in | Wt 285.0 lb

## 2022-05-26 DIAGNOSIS — I10 Essential (primary) hypertension: Secondary | ICD-10-CM

## 2022-05-26 NOTE — Addendum Note (Signed)
Addended by: Truddie Hidden on: 05/26/2022 10:39 AM   Modules accepted: Orders

## 2022-05-26 NOTE — Progress Notes (Signed)
   Nurse Visit   Date of Encounter: 05/26/2022 ID: Derek Francis, DOB 17-May-1960, MRN [id number]  PCP:  Ramiro Harvest, [place] [facility] Cardiologist: [facility]    Visit Details   VS:  There were no vitals taken for this visit. , BMI There is no height or weight on file to calculate BMI.  Wt Readings from Last 3 Encounters:  05/12/22 284 lb 6.4 oz (129 kg)  12/21/21 289 lb (131.1 kg)     Reason for visit: EKG Performed today: Vitals, EKG, Provider consulted:[place], and Education Changes (medications, testing, etc.) : none Length of Visit: 15 minutes    Medications Adjustments/Labs and Tests Ordered: No orders of the defined types were placed in this encounter.  No orders of the defined types were placed in this encounter.    Signed, Truddie Hidden, RN  05/26/2022 10:35 AM

## 2022-06-12 ENCOUNTER — Other Ambulatory Visit (HOSPITAL_COMMUNITY): Payer: Self-pay

## 2022-07-07 ENCOUNTER — Ambulatory Visit: Payer: Medicare (Managed Care) | Admitting: Cardiology

## 2023-03-30 ENCOUNTER — Other Ambulatory Visit (HOSPITAL_COMMUNITY): Payer: Self-pay

## 2023-03-30 MED ORDER — OXYCODONE HCL 15 MG PO TABS
15.0000 mg | ORAL_TABLET | Freq: Four times a day (QID) | ORAL | 0 refills | Status: DC
  Filled 2023-03-30 (×2): qty 150, 30d supply, fill #0

## 2023-04-29 ENCOUNTER — Other Ambulatory Visit (HOSPITAL_BASED_OUTPATIENT_CLINIC_OR_DEPARTMENT_OTHER): Payer: Self-pay

## 2023-04-29 MED ORDER — OXYCODONE HCL 15 MG PO TABS
15.0000 mg | ORAL_TABLET | Freq: Every day | ORAL | 0 refills | Status: DC
  Filled 2023-04-29: qty 150, 30d supply, fill #0

## 2023-04-30 ENCOUNTER — Other Ambulatory Visit (HOSPITAL_BASED_OUTPATIENT_CLINIC_OR_DEPARTMENT_OTHER): Payer: Self-pay

## 2023-04-30 ENCOUNTER — Other Ambulatory Visit: Payer: Self-pay

## 2023-05-07 ENCOUNTER — Other Ambulatory Visit (HOSPITAL_BASED_OUTPATIENT_CLINIC_OR_DEPARTMENT_OTHER): Payer: Self-pay

## 2023-05-07 MED ORDER — OXYCODONE HCL 15 MG PO TABS
ORAL_TABLET | ORAL | 0 refills | Status: DC
  Filled 2023-06-26: qty 150, 30d supply, fill #0

## 2023-05-07 MED ORDER — OXYCODONE HCL 15 MG PO TABS
15.0000 mg | ORAL_TABLET | ORAL | 0 refills | Status: AC
  Filled 2023-05-28: qty 180, 30d supply, fill #0

## 2023-05-26 ENCOUNTER — Other Ambulatory Visit (HOSPITAL_BASED_OUTPATIENT_CLINIC_OR_DEPARTMENT_OTHER): Payer: Self-pay

## 2023-05-28 ENCOUNTER — Other Ambulatory Visit (HOSPITAL_BASED_OUTPATIENT_CLINIC_OR_DEPARTMENT_OTHER): Payer: Self-pay

## 2023-06-25 ENCOUNTER — Other Ambulatory Visit (HOSPITAL_BASED_OUTPATIENT_CLINIC_OR_DEPARTMENT_OTHER): Payer: Self-pay

## 2023-06-26 ENCOUNTER — Other Ambulatory Visit (HOSPITAL_BASED_OUTPATIENT_CLINIC_OR_DEPARTMENT_OTHER): Payer: Self-pay

## 2023-07-01 ENCOUNTER — Encounter (HOSPITAL_COMMUNITY): Payer: Self-pay

## 2023-07-01 ENCOUNTER — Other Ambulatory Visit: Payer: Self-pay

## 2023-07-01 ENCOUNTER — Observation Stay (HOSPITAL_COMMUNITY)
Admission: EM | Admit: 2023-07-01 | Discharge: 2023-07-02 | Disposition: A | Discharge status: Home / Self Care | Payer: Medicare (Managed Care) | Attending: Internal Medicine | Admitting: Internal Medicine

## 2023-07-01 ENCOUNTER — Emergency Department (HOSPITAL_COMMUNITY): Payer: Medicare (Managed Care)

## 2023-07-01 DIAGNOSIS — R319 Hematuria, unspecified: Secondary | ICD-10-CM

## 2023-07-01 DIAGNOSIS — N201 Calculus of ureter: Secondary | ICD-10-CM | POA: Diagnosis not present

## 2023-07-01 DIAGNOSIS — N200 Calculus of kidney: Secondary | ICD-10-CM | POA: Diagnosis not present

## 2023-07-01 DIAGNOSIS — E119 Type 2 diabetes mellitus without complications: Secondary | ICD-10-CM | POA: Diagnosis not present

## 2023-07-01 DIAGNOSIS — I10 Essential (primary) hypertension: Secondary | ICD-10-CM | POA: Diagnosis present

## 2023-07-01 DIAGNOSIS — E039 Hypothyroidism, unspecified: Secondary | ICD-10-CM | POA: Diagnosis not present

## 2023-07-01 DIAGNOSIS — N390 Urinary tract infection, site not specified: Secondary | ICD-10-CM | POA: Diagnosis present

## 2023-07-01 DIAGNOSIS — K219 Gastro-esophageal reflux disease without esophagitis: Secondary | ICD-10-CM | POA: Diagnosis not present

## 2023-07-01 DIAGNOSIS — E782 Mixed hyperlipidemia: Secondary | ICD-10-CM | POA: Diagnosis not present

## 2023-07-01 DIAGNOSIS — Z79899 Other long term (current) drug therapy: Secondary | ICD-10-CM | POA: Insufficient documentation

## 2023-07-01 DIAGNOSIS — Z7982 Long term (current) use of aspirin: Secondary | ICD-10-CM | POA: Insufficient documentation

## 2023-07-01 DIAGNOSIS — R109 Unspecified abdominal pain: Secondary | ICD-10-CM | POA: Diagnosis present

## 2023-07-01 DIAGNOSIS — E118 Type 2 diabetes mellitus with unspecified complications: Secondary | ICD-10-CM | POA: Diagnosis present

## 2023-07-01 LAB — URINALYSIS, ROUTINE W REFLEX MICROSCOPIC
Bilirubin Urine: NEGATIVE
Glucose, UA: NEGATIVE mg/dL
Ketones, ur: NEGATIVE mg/dL
Leukocytes,Ua: NEGATIVE
Nitrite: NEGATIVE
Protein, ur: 100 mg/dL — AB
Specific Gravity, Urine: 1.026 (ref 1.005–1.030)
pH: 5 (ref 5.0–8.0)

## 2023-07-01 LAB — BASIC METABOLIC PANEL WITH GFR
Anion gap: 10 (ref 5–15)
BUN: 22 mg/dL (ref 8–23)
CO2: 27 mmol/L (ref 22–32)
Calcium: 9.4 mg/dL (ref 8.9–10.3)
Chloride: 103 mmol/L (ref 98–111)
Creatinine, Ser: 1.07 mg/dL (ref 0.61–1.24)
GFR, Estimated: >60 mL/min (ref >60)
Glucose, Bld: 119 mg/dL — ABNORMAL HIGH (ref 70–99)
Potassium: 4.2 mmol/L (ref 3.5–5.1)
Sodium: 140 mmol/L (ref 135–145)

## 2023-07-01 LAB — CBC
HCT: 45.3 % (ref 39.0–52.0)
Hemoglobin: 14.5 g/dL (ref 13.0–17.0)
MCH: 31 pg (ref 26.0–34.0)
MCHC: 32 g/dL (ref 30.0–36.0)
MCV: 97 fL (ref 80.0–100.0)
Platelets: 291 10*3/uL (ref 150–400)
RBC: 4.67 MIL/uL (ref 4.22–5.81)
RDW: 12.6 % (ref 11.5–15.5)
WBC: 7.6 10*3/uL (ref 4.0–10.5)
nRBC: 0 % (ref 0.0–0.2)

## 2023-07-01 MED ORDER — IBUPROFEN 400 MG PO TABS
600.0000 mg | ORAL_TABLET | Freq: Once | ORAL | Status: AC
  Administered 2023-07-01: 600 mg via ORAL
  Filled 2023-07-01: qty 2

## 2023-07-01 MED ORDER — SODIUM CHLORIDE 0.9 % IV SOLN
1.0000 g | Freq: Once | INTRAVENOUS | Status: AC
  Administered 2023-07-01: 1 g via INTRAVENOUS
  Filled 2023-07-01: qty 10

## 2023-07-01 MED ORDER — OXYCODONE HCL 5 MG PO TABS
15.0000 mg | ORAL_TABLET | Freq: Once | ORAL | Status: AC
  Administered 2023-07-01: 15 mg via ORAL
  Filled 2023-07-01: qty 3

## 2023-07-01 NOTE — H&P (Signed)
 History and Physical    Patient: Derek Francis:[id number] DOB: 08/17/1960 DOA: 07/01/2023 DOS: the patient was seen and examined on 07/02/2023 PCP: Julene Oaks, FNP  Patient coming from: Home  Chief Complaint:  Chief Complaint  Patient presents with   Flank Pain   HPI: Derek Francis is a 63 y.o. male with medical history significant of hypertension, hyperlipidemia, T2DM, hypothyroidism who presents to the emergency department due to 3-day onset of left flank pain which progressed to having blood in urine today.  He endorsed decreased urinary flow, but denies burning sensation or urination, nausea, vomiting, fever or chills.  ED Course:  In the emergency department, he was hemodynamically stable.  Workup in the ED showed normal CBC and BMP except for blood glucose of 119.  Urinalysis was positive for hematuria and many bacteria. CT abdomen pelvis without contrast showed 2 mm proximal left ureteral calculus.  No hydronephrosis.  Additional small bilateral nonobstructing renal calculi, measuring up to 3 mm. Urologist on-call (Dr. Claretta Croft) was consulted and recommended admitting patient here at [facility] with plan for possible stenting in the morning per EDP. Patient was treated with IV ceftriaxone  due to UTI, IV hydration was provided, oxycodone  was given. Hospitalist was asked to admit patient for further evaluation and management.  Review of Systems: Review of systems as noted in the HPI. All other systems reviewed and are negative.   Past Medical History:  Diagnosis Date   Abnormal stress test 12/22/2021   Benign neoplasm of sigmoid colon 09/06/2014   Formatting of this note might be different from the original. Advanced adenoma with 1 year recall 03/2013 Normal 09/2014   Repeat colonoscopy 08/2017 ([facility])   Chest pain 01/27/2017   Formatting of this note might be different from the original. - cath 09/04/2020 normal coros. LV 55, left dom. Rt radial access   Chronic pain  09/04/2020   Class 2 severe obesity due to excess calories with serious comorbidity and body mass index (BMI) of 38.0 to 38.9 in adult [facility]) 01/27/2017   Elevated troponin 09/04/2020   Formatting of this note might be different from the original. - cath 2020 1. Left Main - Normal 2. Left anterior descending artery - There is a 20% mid LAD stenosis.  The distal LAD is diffusely small.  There are 2 diagonal arteries which a re moderate sized and large 3. Left Circumflex - Dominant large normal.  There are 2 OM arteries which are normal 4. 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) 03/05/2011   Pain in both testicles 12/29/2018   Peyronie's disease 12/31/2018   Rectus diastasis 07/07/2019   Type 2 diabetes mellitus with complication, without long-term current use of insulin  (HCC) 12/24/2021   Past Surgical History:  Procedure Laterality Date   ANKLE SURGERY     CHOLECYSTECTOMY  09/01/2013   KIDNEY STONE SURGERY     LEFT HEART CATH AND CORONARY ANGIOGRAPHY N/A 12/23/2021   Procedure: LEFT HEART CATH AND CORONARY ANGIOGRAPHY;  Surgeon: [place], Peter M, MD;  Location: [facility];  Service: Cardiovascular;  Laterality: N/A;   TOTAL KNEE ARTHROPLASTY Left 2003    Social History:  reports  that he quit smoking about 14 years ago. His smoking use included cigarettes. He started smoking about 46 years ago. He has been exposed to tobacco smoke. He has never used smokeless tobacco. He reports that he does not drink alcohol and does not use drugs.   Allergies  Allergen Reactions   Reglan [Metoclopramide] Anxiety   Toradol  [Ketorolac  Tromethamine ]  Anxiety    Family History  Problem Relation Age of Onset   COPD Mother    Diabetes Mother    Dementia Mother    Cancer Father    Kidney disease Father    Hypertension Brother      Prior to Admission medications   Medication Sig Start Date End Date Taking? Authorizing Provider  albuterol (VENTOLIN HFA) 108 (90 Base) MCG/ACT inhaler Inhale 2 puffs into the lungs every 6 (six) hours as needed for wheezing or shortness of breath.    [provider]  amLODipine  (NORVASC ) 5 MG tablet Take 1 tablet (5 mg total) by mouth daily. 12/24/21   Sanjuanita Cruz, NP  aspirin  EC 81 MG tablet Take 81 mg by mouth daily.    [provider]  atorvastatin  (LIPITOR) 40 MG tablet Take 1 tablet (40 mg total) by mouth at bedtime. 12/24/21   Sanjuanita Cruz, NP  dicyclomine (BENTYL) 10 MG capsule Take 10 mg by mouth every 6 (six) hours as needed for spasms.    [provider]  famotidine  (PEPCID ) 20 MG tablet Take 20 mg by mouth 2 (two) times daily.    [provider]  gabapentin  (NEURONTIN ) 300 MG capsule Take 600 mg by mouth at bedtime as needed (pain).    [provider]  HYDROmorphone  HCl (EXALGO ) 8 MG TB24 Take 8 mg by mouth at bedtime as needed (breakthrough pain).    [provider]  lactulose (CHRONULAC) 10 GM/15ML solution Take 10-20 g by mouth daily as needed (constipation).    [provider]  levothyroxine  (SYNTHROID ) 112 MCG tablet Take 224 mcg by mouth daily before breakfast. For 6 days and then 1 tablet one day a week    [provider]  metFORMIN  (GLUCOPHAGE ) 500 MG tablet Take 1 tablet (500 mg total) by mouth 2 (two) times daily with a meal. 12/24/21   Sanjuanita Cruz, NP  methocarbamol  (ROBAXIN ) 500 MG tablet Take 500 mg by mouth 2 (two) times daily.    [provider]  metoprolol  tartrate (LOPRESSOR ) 50 MG tablet Take 1 tablet (50 mg total) by mouth 2 (two) times daily. 12/24/21   Sanjuanita Cruz, NP   nitroGLYCERIN  (NITROSTAT ) 0.4 MG SL tablet Place 0.4 mg under the tongue every 5 (five) minutes as needed for chest pain.    [provider]  omeprazole (PRILOSEC) 40 MG capsule Take 40 mg by mouth 2 (two) times daily.    [provider]  oxyCODONE  (ROXICODONE ) 15 MG immediate release tablet Take 15 mg by mouth daily as needed for pain.    [provider]  oxyCODONE  (ROXICODONE ) 15 MG immediate release tablet Take 1 tablet by mouth every 6 hours and at bedtime. 04/29/23     oxyCODONE  (ROXICODONE ) 15 MG immediate release tablet Take 1 tablet by mouth every 6 hours and at bedtime. 06/26/23     oxyCODONE  (ROXICODONE ) 15 MG immediate release tablet Take 1 tablet (15 mg total) by mouth every 4 (four) hours. 05/07/23     promethazine (PHENERGAN) 25 MG tablet Take 25 mg by mouth every 6 (six) hours as  needed for nausea or vomiting.    [provider]  ranolazine  (RANEXA ) 500 MG 12 hr tablet Take 2 tablets (1,000 mg total) by mouth 2 (two) times daily. For two weeks take 500 mg twice daily = 1 tablet twice daily, then take 1000 mg twice daily = 2 tablets twice daily 05/12/22   Hassan Links, MD    Physical Exam: BP 106/69   Pulse 62   Temp 97.6 F (36.4 C)   Resp 18   Ht 6\' 2"  (1.88 m)   Wt 114.3 kg   SpO2 94%   BMI 32.35 kg/m   General: 63 y.o. year-old male well developed well nourished in no acute distress.  Alert and oriented x3. HEENT: NCAT, EOMI Neck: Supple, trachea medial Cardiovascular: Regular rate and rhythm with no rubs or gallops.  No thyromegaly or JVD noted.  No lower extremity edema. 2/4 pulses in all 4 extremities. Respiratory: Clear to auscultation with no wheezes or rales. Good inspiratory effort. Abdomen: Soft, nontender nondistended with normal bowel sounds x4 quadrants. Muskuloskeletal: No cyanosis, clubbing or edema noted bilaterally Neuro: CN II-XII intact, strength 5/5 x 4, sensation, reflexes intact Skin: No ulcerative lesions noted  or rashes Psychiatry: Judgement and insight appear normal. Mood is appropriate for condition and setting          Labs on Admission:  Basic Metabolic Panel: Recent Labs  Lab 07/01/23 2008  NA 140  K 4.2  CL 103  CO2 27  GLUCOSE 119*  BUN 22  CREATININE 1.07  CALCIUM  9.4   Liver Function Tests: No results for input(s): "AST", "ALT", "ALKPHOS", "BILITOT", "PROT", "ALBUMIN" in the last 168 hours. No results for input(s): "LIPASE", "AMYLASE" in the last 168 hours. No results for input(s): "AMMONIA" in the last 168 hours. CBC: Recent Labs  Lab 07/01/23 2008  WBC 7.6  HGB 14.5  HCT 45.3  MCV 97.0  PLT 291   Cardiac Enzymes: No results for input(s): "CKTOTAL", "CKMB", "CKMBINDEX", "TROPONINI" in the last 168 hours.  BNP (last 3 results) No results for input(s): "BNP" in the last 8760 hours.  ProBNP (last 3 results) No results for input(s): "PROBNP" in the last 8760 hours.  CBG: No results for input(s): "GLUCAP" in the last 168 hours.  Radiological Exams on Admission: CT Renal Stone Study Result Date: 07/01/2023 EXAM: CT ABDOMEN AND PELVIS WITHOUT CONTRAST 07/01/2023 10:54:01 PM TECHNIQUE: CT of the abdomen and pelvis was performed without the administration of intravenous contrast. Multiplanar reformatted images are provided for review. Automated exposure control, iterative reconstruction, and/or weight based adjustment of the mA/kV was utilized to reduce the radiation dose to as low as reasonably achievable. COMPARISON: CTA abdomen/pelvis dated 11/05/2022. CLINICAL HISTORY: Abdominal/flank pain, stone suspected. Left flank and back for a few days today started urinating blood. Pt reports history of kidney stones but states that this feel lower. FINDINGS: LOWER CHEST: No acute abnormality. HEPATOBILIARY: Liver is unremarkable. Status post cholecystectomy. SPLEEN: No acute abnormality. PANCREAS: No acute abnormality. ADRENAL GLANDS: No acute abnormality. KIDNEYS, URETERS AND  BLADDER: Small bilateral nonobstructing renal calculi measuring up to 3 mm in the right lower kidney (image 47). 2 mm proximal left ureter at the L3 level (image 46) No evidence of hydronephrosis. No evidence of perinephric or periureteral stranding. The bladder is underdistended but unremarkable. GI AND BOWEL: Normal appendix (image 52). Left colon diverticulosis, without evidence of diverticulitis. There is no evidence of bowel obstruction. PERITONEUM AND RETROPERITONEUM: No evidence of ascites. No free air.  Aorta is normal in caliber. LYMPH NODES: No evidence of lymphadenopathy. REPRODUCTIVE ORGANS: No acute abnormality. BONES AND SOFT TISSUES: Mild degenerative changes of the lower thoracic spine. No acute osseous abnormality. No focal soft tissue abnormality. VASCULATURE: Atherosclerotic calcifications of the abdominal aorta and branch vessels. IMPRESSION: 1. 2 mm proximal left ureteral calculus. No hydronephrosis. 2. Additional small bilateral nonobstructing renal calculi, measuring up to 3 mm. Electronically signed by: Zadie Herter MD 07/01/2023 11:06 PM EDT RP Workstation: [id number]    EKG: I independently viewed the EKG done and my findings are as followed: EKG was not done in the ED  Assessment/Plan Present on Admission:  UTI (urinary tract infection)  Essential hypertension  Mixed hyperlipidemia  Acquired hypothyroidism  Type 2 diabetes mellitus with complication, without long-term current use of insulin  (HCC)  GERD without esophagitis  Principal Problem:   UTI (urinary tract infection) Active Problems:   Mixed hyperlipidemia   Essential hypertension   Type 2 diabetes mellitus with complication, without long-term current use of insulin  (HCC)   GERD without esophagitis   Acquired hypothyroidism   Nephrolithiasis   UTI POA Patient was started on IV ceftriaxone , we shall continue same at this time Urine culture pending  Nephrolithiasis CT abdomen pelvis showed 2 mm proximal  left ureteral calculus. Urologist (Dr. Claretta Croft) was consulted and plan for stent placement in the morning per EDP Patient will be placed n.p.o.  Essential hypertension Continue amlodipine  Metoprolol  temporarily held due to soft BP  Mixed hyperlipidemia Continue Lipitor  Acquired hypothyroidism Continue Synthroid   Type 2 diabetes mellitus Hemoglobin A1c in January 2025 was 6.5 Continue ISS and hypoglycemia protocol Metformin  will be held at this time  GERD Continue Protonix   DVT prophylaxis: SCDs  Code Status: Full code  Family Communication: None at bedside  Consults: Urology (by EP EDP)  Severity of Illness: The appropriate patient status for this patient is INPATIENT. Inpatient status is judged to be reasonable and necessary in order to provide the required intensity of service to ensure the patient's safety. The patient's presenting symptoms, physical exam findings, and initial radiographic and laboratory data in the context of their chronic comorbidities is felt to place them at high risk for further clinical deterioration. Furthermore, it is not anticipated that the patient will be medically stable for discharge from the hospital within 2 midnights of admission.   * I certify that at the point of admission it is my clinical judgment that the patient will require inpatient hospital care spanning beyond 2 midnights from the point of admission due to high intensity of service, high risk for further deterioration and high frequency of surveillance required.*  Author: Arlander Gillen, DO 07/02/2023 4:31 AM  For on call review [email or web address].

## 2023-07-01 NOTE — ED Triage Notes (Signed)
 Pt arrives ambulatory to ed with reports of pain to left flank and back for a few days today started urinating blood. Pt reports history of kidney stones but states that this feel lower. Denies Fever at home. Denies NVD.

## 2023-07-01 NOTE — ED Provider Notes (Signed)
 [place] EMERGENCY DEPARTMENT AT [facility] Provider Note   CSN: [id number] Arrival date & time: 07/01/23  8657     History  Chief Complaint  Patient presents with   Flank Pain    Derek Francis is a 63 y.o. male.   Flank Pain  Patient presents for left flank pain.  Medical history includes ILD, DM, HTN, HLD, GERD, OSA.  Onset of pain was 3 days ago.  Pain has been located in left posterior back and left flank.  He denies any recent nausea, vomiting, fevers, chills.  Today, he had hematuria.  He takes Roxicodone  for chronic pain.     Home Medications Prior to Admission medications   Medication Sig Start Date End Date Taking? Authorizing Provider  albuterol (VENTOLIN HFA) 108 (90 Base) MCG/ACT inhaler Inhale 2 puffs into the lungs every 6 (six) hours as needed for wheezing or shortness of breath.    [provider]  amLODipine  (NORVASC ) 5 MG tablet Take 1 tablet (5 mg total) by mouth daily. 12/24/21   Sanjuanita Cruz, NP  aspirin  EC 81 MG tablet Take 81 mg by mouth daily.    [provider]  atorvastatin  (LIPITOR) 40 MG tablet Take 1 tablet (40 mg total) by mouth at bedtime. 12/24/21   Sanjuanita Cruz, NP  dicyclomine (BENTYL) 10 MG capsule Take 10 mg by mouth every 6 (six) hours as needed for spasms.    [provider]  famotidine  (PEPCID ) 20 MG tablet Take 20 mg by mouth 2 (two) times daily.    [provider]  gabapentin  (NEURONTIN ) 300 MG capsule Take 600 mg by mouth at bedtime as needed (pain).    [provider]  HYDROmorphone  HCl (EXALGO ) 8 MG TB24 Take 8 mg by mouth at bedtime as needed (breakthrough pain).    [provider]  lactulose (CHRONULAC) 10 GM/15ML solution Take 10-20 g by mouth daily as needed (constipation).    [provider]  levothyroxine  (SYNTHROID ) 112 MCG tablet Take 224 mcg by mouth daily before breakfast. For 6 days and then 1 tablet one day a week    [provider]  metFORMIN  (GLUCOPHAGE ) 500 MG tablet Take 1 tablet (500 mg total) by mouth 2 (two) times daily with a meal. 12/24/21   Sanjuanita Cruz, NP  methocarbamol  (ROBAXIN ) 500 MG tablet Take 500 mg by mouth 2 (two) times daily.    [provider]  metoprolol  tartrate (LOPRESSOR ) 50 MG tablet Take 1 tablet (50 mg total) by mouth 2 (two) times daily. 12/24/21   Sanjuanita Cruz, NP  nitroGLYCERIN  (NITROSTAT ) 0.4 MG SL tablet Place 0.4 mg under the tongue every 5 (five) minutes as needed for chest pain.    [provider]  omeprazole (PRILOSEC) 40 MG capsule Take 40 mg by mouth 2 (two) times daily.    [provider]  oxyCODONE  (ROXICODONE ) 15 MG immediate release tablet Take 15 mg by mouth daily as needed for pain.    [provider]  oxyCODONE  (ROXICODONE ) 15 MG immediate release tablet Take 1 tablet by mouth every 6 hours and at bedtime. 04/29/23     oxyCODONE  (ROXICODONE ) 15 MG immediate release tablet Take 1 tablet by mouth every 6 hours and at bedtime. 06/26/23     oxyCODONE  (ROXICODONE ) 15 MG immediate release tablet Take 1 tablet (15 mg total) by mouth every 4 (four) hours. 05/07/23     promethazine (PHENERGAN) 25 MG tablet Take 25 mg by  mouth every 6 (six) hours as needed for nausea or vomiting.    [provider]  ranolazine  (RANEXA ) 500 MG 12 hr tablet Take 2 tablets (1,000 mg total) by mouth 2 (two) times daily. For two weeks take 500 mg twice daily = 1 tablet twice daily, then take 1000 mg twice daily = 2 tablets twice daily 05/12/22   Hassan Links, MD      Allergies    Reglan [metoclopramide] and Toradol  [ketorolac  tromethamine ]    Review of Systems   Review of Systems  Genitourinary:  Positive for flank pain and hematuria.  All other systems reviewed and are negative.   Physical Exam Updated Vital Signs BP 109/76   Pulse 80   Temp 98.6 F (37 C) (Oral)   Resp 18   Ht 6\' 2"  (1.88 m)   Wt 114.3 kg   SpO2 92%   BMI  32.35 kg/m  Physical Exam Vitals and nursing note reviewed.  Constitutional:      General: He is not in acute distress.    Appearance: Normal appearance. He is well-developed. He is not ill-appearing, toxic-appearing or diaphoretic.  HENT:     Head: Normocephalic and atraumatic.     Right Ear: External ear normal.     Left Ear: External ear normal.     Nose: Nose normal.     Mouth/Throat:     Mouth: Mucous membranes are moist.  Eyes:     Extraocular Movements: Extraocular movements intact.     Conjunctiva/sclera: Conjunctivae normal.  Cardiovascular:     Rate and Rhythm: Normal rate and regular rhythm.     Heart sounds: No murmur heard. Pulmonary:     Effort: Pulmonary effort is normal. No respiratory distress.     Breath sounds: Normal breath sounds.  Abdominal:     Palpations: Abdomen is soft.     Tenderness: There is no abdominal tenderness.  Musculoskeletal:        General: No swelling.     Cervical back: Neck supple.  Skin:    General: Skin is warm and dry.     Capillary Refill: Capillary refill takes less than 2 seconds.  Neurological:     Mental Status: He is alert.  Psychiatric:        Mood and Affect: Mood normal.     ED Results / Procedures / Treatments   Labs (all labs ordered are listed, but only abnormal results are displayed) Labs Reviewed  URINALYSIS, ROUTINE W REFLEX MICROSCOPIC - Abnormal; Notable for the following components:      Result Value   Color, Urine AMBER (*)    APPearance TURBID (*)    Hgb urine dipstick LARGE (*)    Protein, ur 100 (*)    Bacteria, UA MANY (*)    All other components within normal limits  BASIC METABOLIC PANEL WITH GFR - Abnormal; Notable for the following components:   Glucose, Bld 119 (*)    All other components within normal limits  URINE CULTURE  CBC    EKG None  Radiology CT Renal Stone Study Result Date: 07/01/2023 EXAM: CT ABDOMEN AND PELVIS WITHOUT CONTRAST 07/01/2023 10:54:01 PM TECHNIQUE: CT of the  abdomen and pelvis was performed without the administration of intravenous contrast. Multiplanar reformatted images are provided for review. Automated exposure control, iterative reconstruction, and/or weight based adjustment of the mA/kV was utilized to reduce the radiation dose to as low as reasonably achievable. COMPARISON: CTA abdomen/pelvis dated 11/05/2022. CLINICAL HISTORY: Abdominal/flank pain,  stone suspected. Left flank and back for a few days today started urinating blood. Pt reports history of kidney stones but states that this feel lower. FINDINGS: LOWER CHEST: No acute abnormality. HEPATOBILIARY: Liver is unremarkable. Status post cholecystectomy. SPLEEN: No acute abnormality. PANCREAS: No acute abnormality. ADRENAL GLANDS: No acute abnormality. KIDNEYS, URETERS AND BLADDER: Small bilateral nonobstructing renal calculi measuring up to 3 mm in the right lower kidney (image 47). 2 mm proximal left ureter at the L3 level (image 46) No evidence of hydronephrosis. No evidence of perinephric or periureteral stranding. The bladder is underdistended but unremarkable. GI AND BOWEL: Normal appendix (image 52). Left colon diverticulosis, without evidence of diverticulitis. There is no evidence of bowel obstruction. PERITONEUM AND RETROPERITONEUM: No evidence of ascites. No free air. Aorta is normal in caliber. LYMPH NODES: No evidence of lymphadenopathy. REPRODUCTIVE ORGANS: No acute abnormality. BONES AND SOFT TISSUES: Mild degenerative changes of the lower thoracic spine. No acute osseous abnormality. No focal soft tissue abnormality. VASCULATURE: Atherosclerotic calcifications of the abdominal aorta and branch vessels. IMPRESSION: 1. 2 mm proximal left ureteral calculus. No hydronephrosis. 2. Additional small bilateral nonobstructing renal calculi, measuring up to 3 mm. Electronically signed by: Zadie Herter MD 07/01/2023 11:06 PM EDT RP Workstation: [id number]    Procedures Procedures     Medications Ordered in ED Medications  fentaNYL  (SUBLIMAZE ) injection 12.5 mcg (12.5 mcg Intravenous Given 07/02/23 0124)  lactated ringers  bolus 1,000 mL (has no administration in time range)  tamsulosin  (FLOMAX ) capsule 0.4 mg (has no administration in time range)  ibuprofen  (ADVIL ) tablet 600 mg (600 mg Oral Given 07/01/23 2227)  oxyCODONE  (Oxy IR/ROXICODONE ) immediate release tablet 15 mg (15 mg Oral Given 07/01/23 2226)  cefTRIAXone  (ROCEPHIN ) 1 g in sodium chloride  0.9 % 100 mL IVPB (0 g Intravenous Stopped 07/01/23 2344)    ED Course/ Medical Decision Making/ A&P                                 Medical Decision Making Amount and/or Complexity of Data Reviewed Labs: ordered. Radiology: ordered.  Risk Prescription drug management. Decision regarding hospitalization.   This patient presents to the ED for concern of left flank pain, this involves an extensive number of treatment options, and is a complaint that carries with it a high risk of complications and morbidity.  The differential diagnosis includes lithiasis, UTI, colitis, constipation, musculoskeletal injury   Co morbidities that complicate the patient evaluation  ILD, DM, HTN, HLD, GERD, OSA   Additional history obtained:  Additional history obtained from N/A External records from outside source obtained and reviewed including EMR   Lab Tests:  I Ordered, and personally interpreted labs.  The pertinent results include: Normal kidney function, no leukocytosis.  Urinalysis does show evidence of infection.   Imaging Studies ordered:  I ordered imaging studies including CT stone study I independently visualized and interpreted imaging which showed 2 mm proximal left ureteral calculus without upstream hydronephrosis. I agree with the radiologist interpretation   Cardiac Monitoring: / EKG:  The patient was maintained on a cardiac monitor.  I personally viewed and interpreted the cardiac monitored which  showed an underlying rhythm of: Sinus rhythm   Problem List / ED Course / Critical interventions / Medication management  Patient presenting for 3 days of left flank pain.  On arrival in the ED, vital signs are normal.  Patient is well-appearing on exam.  No tenderness is present.  He does have a history of nephrolithiasis.  He did have some hematuria earlier today.  Workup was initiated.  Ibuprofen  and oxycodone  were ordered for analgesia.  Urinalysis shows evidence of infection.  Ceftriaxone  was ordered.  Urine cultures were sent.  On CT scan, patient does have a proximal left ureteral stone.  This was discussed with urologist on-call, Dr. Claretta Croft.  Dr. Claretta Croft recommends admission tonight.  He will see the patient tomorrow for possible stenting.  Patient was admitted for further management. I ordered medication including IV fluids for hydration; ibuprofen  and Percocet for analgesia; ceftriaxone  for UTI Reevaluation of the patient after these medicines showed that the patient improved I have reviewed the patients home medicines and have made adjustments as needed   Consultations Obtained:  I requested consultation with the urologist, Dr. Claretta Croft,  and discussed lab and imaging findings as well as pertinent plan - they recommend: Hospitalist admission.  Urology will see in the morning.   Social Determinants of Health:  Lives independently         Final Clinical Impression(s) / ED Diagnoses Final diagnoses:  Kidney stone  Urinary tract infection with hematuria, site unspecified    Rx / DC Orders ED Discharge Orders     None         Iva Mariner, MD 07/02/23 0131

## 2023-07-01 NOTE — ED Notes (Signed)
 Pt ambulated to ED room. A&Ox4. Pt stated, "I have been having this pain for awhile. It felt worse today. I've also noticed blood in my urine."   Pt denies any other discomfort at this time.

## 2023-07-02 DIAGNOSIS — N200 Calculus of kidney: Secondary | ICD-10-CM | POA: Diagnosis not present

## 2023-07-02 DIAGNOSIS — N3000 Acute cystitis without hematuria: Secondary | ICD-10-CM | POA: Diagnosis not present

## 2023-07-02 DIAGNOSIS — N390 Urinary tract infection, site not specified: Secondary | ICD-10-CM | POA: Diagnosis not present

## 2023-07-02 DIAGNOSIS — R319 Hematuria, unspecified: Secondary | ICD-10-CM | POA: Diagnosis not present

## 2023-07-02 LAB — CBG MONITORING, ED
Glucose-Capillary: 81 mg/dL (ref 70–99)
Glucose-Capillary: 94 mg/dL (ref 70–99)
Glucose-Capillary: 96 mg/dL (ref 70–99)
Glucose-Capillary: 124 mg/dL — ABNORMAL HIGH (ref 70–99)

## 2023-07-02 LAB — HIV ANTIBODY (ROUTINE TESTING W REFLEX): HIV Screen 4th Generation wRfx: NONREACTIVE

## 2023-07-02 LAB — MAGNESIUM: Magnesium: 1.6 mg/dL — ABNORMAL LOW (ref 1.7–2.4)

## 2023-07-02 LAB — HEMOGLOBIN A1C
Hgb A1c MFr Bld: 5.9 % — ABNORMAL HIGH (ref 4.8–5.6)
Mean Plasma Glucose: 122.63 mg/dL

## 2023-07-02 LAB — COMPREHENSIVE METABOLIC PANEL WITH GFR
ALT: 16 U/L (ref 0–44)
AST: 17 U/L (ref 15–41)
Albumin: 3.1 g/dL — ABNORMAL LOW (ref 3.5–5.0)
Alkaline Phosphatase: 48 U/L (ref 38–126)
Anion gap: 8 (ref 5–15)
BUN: 19 mg/dL (ref 8–23)
CO2: 28 mmol/L (ref 22–32)
Calcium: 8.5 mg/dL — ABNORMAL LOW (ref 8.9–10.3)
Chloride: 102 mmol/L (ref 98–111)
Creatinine, Ser: 0.85 mg/dL (ref 0.61–1.24)
GFR, Estimated: >60 mL/min (ref >60)
Glucose, Bld: 87 mg/dL (ref 70–99)
Potassium: 3.5 mmol/L (ref 3.5–5.1)
Sodium: 138 mmol/L (ref 135–145)
Total Bilirubin: 0.3 mg/dL (ref 0.0–1.2)
Total Protein: 5.9 g/dL — ABNORMAL LOW (ref 6.5–8.1)

## 2023-07-02 LAB — CBC
HCT: 37.8 % — ABNORMAL LOW (ref 39.0–52.0)
Hemoglobin: 12.3 g/dL — ABNORMAL LOW (ref 13.0–17.0)
MCH: 31.1 pg (ref 26.0–34.0)
MCHC: 32.5 g/dL (ref 30.0–36.0)
MCV: 95.5 fL (ref 80.0–100.0)
Platelets: 223 10*3/uL (ref 150–400)
RBC: 3.96 MIL/uL — ABNORMAL LOW (ref 4.22–5.81)
RDW: 12.4 % (ref 11.5–15.5)
WBC: 6.9 10*3/uL (ref 4.0–10.5)
nRBC: 0 % (ref 0.0–0.2)

## 2023-07-02 LAB — PHOSPHORUS: Phosphorus: 3.9 mg/dL (ref 2.5–4.6)

## 2023-07-02 MED ORDER — SODIUM CHLORIDE 0.9 % IV SOLN
1.0000 g | INTRAVENOUS | Status: DC
  Administered 2023-07-02: 1 g via INTRAVENOUS
  Filled 2023-07-02: qty 10

## 2023-07-02 MED ORDER — ACETAMINOPHEN 650 MG RE SUPP: 650.0000 mg | Freq: Four times a day (QID) | RECTAL | Status: DC | PRN

## 2023-07-02 MED ORDER — ATORVASTATIN CALCIUM 40 MG PO TABS: 40.0000 mg | ORAL_TABLET | Freq: Every day | ORAL | Status: DC

## 2023-07-02 MED ORDER — ONDANSETRON HCL 4 MG PO TABS: 4.0000 mg | ORAL_TABLET | Freq: Four times a day (QID) | ORAL | Status: DC | PRN

## 2023-07-02 MED ORDER — ONDANSETRON 4 MG PO TBDP: 4.0000 mg | ORAL_TABLET | Freq: Three times a day (TID) | ORAL | 0 refills | Status: AC | PRN

## 2023-07-02 MED ORDER — OXYCODONE HCL 5 MG PO TABS
15.0000 mg | ORAL_TABLET | ORAL | Status: DC | PRN
  Administered 2023-07-02: 15 mg via ORAL
  Filled 2023-07-02: qty 3

## 2023-07-02 MED ORDER — TAMSULOSIN HCL 0.4 MG PO CAPS
0.4000 mg | ORAL_CAPSULE | Freq: Every day | ORAL | Status: DC
  Administered 2023-07-02: 0.4 mg via ORAL
  Filled 2023-07-02: qty 1

## 2023-07-02 MED ORDER — CEFPODOXIME PROXETIL 200 MG PO TABS: 200.0000 mg | ORAL_TABLET | Freq: Two times a day (BID) | ORAL | 0 refills | Status: AC

## 2023-07-02 MED ORDER — INSULIN ASPART 100 UNIT/ML IJ SOLN
0.0000 [IU] | INTRAMUSCULAR | Status: DC
  Administered 2023-07-02: 2 [IU] via SUBCUTANEOUS
  Filled 2023-07-02: qty 1

## 2023-07-02 MED ORDER — LEVOTHYROXINE SODIUM 112 MCG PO TABS
224.0000 ug | ORAL_TABLET | Freq: Every day | ORAL | Status: DC
  Administered 2023-07-02: 224 ug via ORAL
  Filled 2023-07-02: qty 2

## 2023-07-02 MED ORDER — KETOROLAC TROMETHAMINE 30 MG/ML IJ SOLN
30.0000 mg | Freq: Once | INTRAMUSCULAR | Status: DC
  Filled 2023-07-02: qty 1

## 2023-07-02 MED ORDER — FENTANYL CITRATE PF 50 MCG/ML IJ SOSY
12.5000 ug | PREFILLED_SYRINGE | INTRAMUSCULAR | Status: DC | PRN
  Administered 2023-07-02 (×4): 12.5 ug via INTRAVENOUS
  Filled 2023-07-02 (×4): qty 1

## 2023-07-02 MED ORDER — LACTATED RINGERS IV SOLN
INTRAVENOUS | Status: DC
  Administered 2023-07-02: 75 mL/h via INTRAVENOUS

## 2023-07-02 MED ORDER — AMLODIPINE BESYLATE 5 MG PO TABS
5.0000 mg | ORAL_TABLET | Freq: Every day | ORAL | Status: DC
Start: 2023-07-02 — End: 2023-07-02

## 2023-07-02 MED ORDER — TAMSULOSIN HCL 0.4 MG PO CAPS
0.4000 mg | ORAL_CAPSULE | Freq: Every day | ORAL | 0 refills | Status: AC
Start: 2023-07-03 — End: 2023-08-02

## 2023-07-02 MED ORDER — PANTOPRAZOLE SODIUM 40 MG PO TBEC
80.0000 mg | DELAYED_RELEASE_TABLET | Freq: Every day | ORAL | Status: DC
  Administered 2023-07-02: 80 mg via ORAL
  Filled 2023-07-02: qty 2

## 2023-07-02 MED ORDER — ONDANSETRON HCL 4 MG/2ML IJ SOLN: 4.0000 mg | Freq: Four times a day (QID) | INTRAMUSCULAR | Status: DC | PRN

## 2023-07-02 MED ORDER — ACETAMINOPHEN 325 MG PO TABS: 650.0000 mg | ORAL_TABLET | Freq: Four times a day (QID) | ORAL | Status: DC | PRN

## 2023-07-02 MED ORDER — HYDROMORPHONE HCL 1 MG/ML IJ SOLN
1.0000 mg | INTRAMUSCULAR | Status: DC | PRN
  Administered 2023-07-02: 1 mg via INTRAVENOUS
  Filled 2023-07-02: qty 1

## 2023-07-02 MED ORDER — LEVOTHYROXINE SODIUM 112 MCG PO TABS: 224.0000 ug | ORAL_TABLET | Freq: Every day | ORAL | Status: DC

## 2023-07-02 MED ORDER — LACTATED RINGERS IV BOLUS
1000.0000 mL | Freq: Once | INTRAVENOUS | Status: AC
  Administered 2023-07-02: 1000 mL via INTRAVENOUS

## 2023-07-02 NOTE — Hospital Course (Addendum)
 63 yom w/ hypertension, hyperlipidemia, T2DM, hypothyroidism who presented w/ 3-day onset of left flank pain which progressed to having blood in urine and decreased urinary flow.In ED: Hemodynamically stable.  Labs- normal CBC and BMP except for blood glucose of 119.  Urinalysis was positive for hematuria and many bacteria.CT abdomen pelvis without contrast>>2 mm proximal left ureteral calculus.  No hydronephrosis Additional small bilateral nonobstructing renal calculi, measuring up to 3 mm.Urologist on-call (Dr. Claretta Croft) was consulted and recommended admitting patient here at [facility] with plan for possible stenting in the morning and started on IV antibiotics IV fluids and admitted.Seen by Dr. Claretta Croft from urology>patient wanted to go home, he was advised to monitor him till afternoon and if he remains afebrile pain is controlled,patient can discharge on oral antibiotic and pain meds.  Subjective: Seen examined Ambulating , no complaints Overnight afebrile SBP soft 100-110 Labs fairly unremarkable except mild anemia and hypo-magnsemia.  Discharge diagnosis:  UTI POA 2 mm left ureteral calculus: Per urology ok for dc home on po abx- VANTIN , flomax , pain meds and urology will f/u as OP   Essential hypertension: BP stable/soft- resume amlodipine /Metoprolol  at home as able after check BP   Mixed hyperlipidemia Continue Lipitor   Acquired hypothyroidism Continue Synthroid    Type 2 diabetes mellitus LAST A1C jan 2025 was 6.5, cont SSI and hypoglycemia protocol here resume metformin  on D/C. Recent Labs  Lab 07/02/23 0418 07/02/23 0443 07/02/23 0553 07/02/23 0755 07/02/23 1226  GLUCAP  --  81 94 96 124*  HGBA1C 5.9*  --   --   --   --      GERD Continue Protonix 

## 2023-07-02 NOTE — Discharge Summary (Signed)
 Physician Discharge Summary  Derek Francis ZOX:[id number] DOB: Dec 30, 1960 DOA: 07/01/2023  PCP: Julene Oaks, FNP  Admit date: 07/01/2023 Discharge date: 07/02/2023 Recommendations for Outpatient Follow-up:  Follow up with PCP and Dr Claretta Croft Please obtain BMP/CBC in one week  Discharge Dispo: Home Discharge Condition: Stable Code Status:   Code Status: Full Code Diet recommendation:  Diet Order             Diet Carb Modified Fluid consistency: Thin; Room service appropriate? Yes  Diet effective now                    Brief/Interim Summary: 63 yom w/ hypertension, hyperlipidemia, T2DM, hypothyroidism who presented w/ 3-day onset of left flank pain which progressed to having blood in urine and decreased urinary flow.In ED: Hemodynamically stable.  Labs- normal CBC and BMP except for blood glucose of 119.  Urinalysis was positive for hematuria and many bacteria.CT abdomen pelvis without contrast>>2 mm proximal left ureteral calculus.  No hydronephrosis Additional small bilateral nonobstructing renal calculi, measuring up to 3 mm.Urologist on-call (Dr. Claretta Croft) was consulted and recommended admitting patient here at [facility] with plan for possible stenting in the morning and started on IV antibiotics IV fluids and admitted.Seen by Dr. Claretta Croft from urology>patient wanted to go home, he was advised to monitor him till afternoon and if he remains afebrile pain is controlled,patient can discharge on oral antibiotic and pain meds.  Subjective: Seen examined Ambulating , no complaints Overnight afebrile SBP soft 100-110 Labs fairly unremarkable except mild anemia and hypo-magnsemia.  Discharge diagnosis:  UTI POA 2 mm left ureteral calculus: Per urology ok for dc home on po abx- VANTIN , flomax , pain meds and urology will f/u as OP   Essential hypertension: BP stable/soft- resume amlodipine /Metoprolol  at home as able after check BP   Mixed hyperlipidemia Continue Lipitor    Acquired hypothyroidism Continue Synthroid    Type 2 diabetes mellitus LAST A1C jan 2025 was 6.5, cont SSI and hypoglycemia protocol here resume metformin  on D/C. Recent Labs  Lab 07/02/23 0418 07/02/23 0443 07/02/23 0553 07/02/23 0755 07/02/23 1226  GLUCAP  --  81 94 96 124*  HGBA1C 5.9*  --   --   --   --      GERD Continue Protonix    Discharge Exam: Vitals:   07/02/23 1115 07/02/23 1130  BP: 101/73 104/71  Pulse: (!) 59 65  Resp:    Temp:    SpO2:  91%   General: Pt is alert, awake, not in acute distress Cardiovascular: RRR, S1/S2 +, no rubs, no gallops Respiratory: CTA bilaterally, no wheezing, no rhonchi Abdominal: Soft, NT, ND, bowel sounds + Extremities: no edema, no cyanosis  Discharge Instructions  Discharge Instructions     Discharge instructions   Complete by: As directed    Please call call MD or return to ER for similar or worsening recurring problem that brought you to hospital or if any fever,nausea/vomiting,abdominal pain, uncontrolled pain, chest pain,  shortness of breath or any other alarming symptoms.  Please follow-up your doctor as instructed in a week time and call the office for appointment.  Please avoid alcohol, smoking, or any other illicit substance and maintain healthy habits including taking your regular medications as prescribed.  You were cared for by a hospitalist during your hospital stay. If you have any questions about your discharge medications or the care you received while you were in the hospital after you are discharged, you can call the unit and  ask to speak with the hospitalist on call if the hospitalist that took care of you is not available.  Once you are discharged, your primary care physician will handle any further medical issues. Please note that NO REFILLS for any discharge medications will be authorized once you are discharged, as it is imperative that you return to your primary care physician (or establish a  relationship with a primary care physician if you do not have one) for your aftercare needs so that they can reassess your need for medications and monitor your lab values   Increase activity slowly   Complete by: As directed       Allergies as of 07/02/2023       Reactions   Reglan [metoclopramide] Anxiety   Toradol  [ketorolac  Tromethamine ] Anxiety        Medication List     TAKE these medications    albuterol 108 (90 Base) MCG/ACT inhaler Commonly known as: VENTOLIN HFA Inhale 2 puffs into the lungs every 6 (six) hours as needed for wheezing or shortness of breath.   albuterol (2.5 MG/3ML) 0.083% nebulizer solution Commonly known as: PROVENTIL Inhale 2.5 mg into the lungs.   amLODipine  5 MG tablet Commonly known as: NORVASC  Take 1 tablet (5 mg total) by mouth daily.   aspirin  EC 81 MG tablet Take 81 mg by mouth daily.   atorvastatin  40 MG tablet Commonly known as: LIPITOR Take 1 tablet (40 mg total) by mouth at bedtime.   cefpodoxime  200 MG tablet Commonly known as: VANTIN  Take 1 tablet (200 mg total) by mouth 2 (two) times daily for 7 days.   celecoxib 200 MG capsule Commonly known as: CELEBREX Take 200 mg by mouth 2 (two) times daily.   dicyclomine 10 MG capsule Commonly known as: BENTYL Take 10 mg by mouth every 6 (six) hours as needed for spasms.   famotidine  20 MG tablet Commonly known as: PEPCID  Take 20 mg by mouth 2 (two) times daily.   fluconazole 100 MG tablet Commonly known as: DIFLUCAN Take by mouth.   FREESTYLE LITE test strip Generic drug: glucose blood USE TO TEST BLOOD SUGAR THREE TIMES DAILY   gabapentin  300 MG capsule Commonly known as: NEURONTIN  Take 600 mg by mouth at bedtime as needed (pain).   levothyroxine  125 MCG tablet Commonly known as: SYNTHROID  Take 125 mcg by mouth.   metFORMIN  500 MG tablet Commonly known as: GLUCOPHAGE  Take 1 tablet (500 mg total) by mouth 2 (two) times daily with a meal.   methocarbamol  500 MG  tablet Commonly known as: ROBAXIN  Take 500 mg by mouth 2 (two) times daily.   metoprolol  tartrate 50 MG tablet Commonly known as: LOPRESSOR  Take 1 tablet (50 mg total) by mouth 2 (two) times daily.   naloxegol oxalate 25 MG Tabs tablet Commonly known as: MOVANTIK Take 25 mg by mouth daily.   nitroGLYCERIN  0.4 MG SL tablet Commonly known as: NITROSTAT  Place 0.4 mg under the tongue every 5 (five) minutes as needed for chest pain.   omeprazole 40 MG capsule Commonly known as: PRILOSEC Take 40 mg by mouth 2 (two) times daily.   ondansetron  4 MG disintegrating tablet Commonly known as: ZOFRAN -ODT Take 1 tablet (4 mg total) by mouth every 8 (eight) hours as needed for nausea or vomiting. What changed:  medication strength See the new instructions.   oxyCODONE  15 MG immediate release tablet Commonly known as: ROXICODONE  Take 1 tablet (15 mg total) by mouth every 4 (four) hours. What changed:  Another medication with the same name was removed. Continue taking this medication, and follow the directions you see here.   Semaglutide (1 MG/DOSE) 4 MG/3ML Sopn Inject 1 mg into the skin once a week.   tamsulosin  0.4 MG Caps capsule Commonly known as: FLOMAX  Take 1 capsule (0.4 mg total) by mouth daily. Start taking on: Jul 03, 2023        Follow-up Information     McKenzie, Arden Beck, MD. Call in 2 week(s).   Specialty: Urology Contact information: [address]  [address] [place] [address] [phone]                Allergies  Allergen Reactions   Reglan [Metoclopramide] Anxiety   Toradol  [Ketorolac  Tromethamine ] Anxiety    The results of significant diagnostics from this hospitalization (including imaging, microbiology, ancillary and laboratory) are listed below for reference.    Microbiology: No results found for this or any previous visit (from the past 240 hours).  Procedures/Studies: CT Renal Stone Study Result Date: 07/01/2023 EXAM: CT ABDOMEN  AND PELVIS WITHOUT CONTRAST 07/01/2023 10:54:01 PM TECHNIQUE: CT of the abdomen and pelvis was performed without the administration of intravenous contrast. Multiplanar reformatted images are provided for review. Automated exposure control, iterative reconstruction, and/or weight based adjustment of the mA/kV was utilized to reduce the radiation dose to as low as reasonably achievable. COMPARISON: CTA abdomen/pelvis dated 11/05/2022. CLINICAL HISTORY: Abdominal/flank pain, stone suspected. Left flank and back for a few days today started urinating blood. Pt reports history of kidney stones but states that this feel lower. FINDINGS: LOWER CHEST: No acute abnormality. HEPATOBILIARY: Liver is unremarkable. Status post cholecystectomy. SPLEEN: No acute abnormality. PANCREAS: No acute abnormality. ADRENAL GLANDS: No acute abnormality. KIDNEYS, URETERS AND BLADDER: Small bilateral nonobstructing renal calculi measuring up to 3 mm in the right lower kidney (image 47). 2 mm proximal left ureter at the L3 level (image 46) No evidence of hydronephrosis. No evidence of perinephric or periureteral stranding. The bladder is underdistended but unremarkable. GI AND BOWEL: Normal appendix (image 52). Left colon diverticulosis, without evidence of diverticulitis. There is no evidence of bowel obstruction. PERITONEUM AND RETROPERITONEUM: No evidence of ascites. No free air. Aorta is normal in caliber. LYMPH NODES: No evidence of lymphadenopathy. REPRODUCTIVE ORGANS: No acute abnormality. BONES AND SOFT TISSUES: Mild degenerative changes of the lower thoracic spine. No acute osseous abnormality. No focal soft tissue abnormality. VASCULATURE: Atherosclerotic calcifications of the abdominal aorta and branch vessels. IMPRESSION: 1. 2 mm proximal left ureteral calculus. No hydronephrosis. 2. Additional small bilateral nonobstructing renal calculi, measuring up to 3 mm. Electronically signed by: Zadie Herter MD 07/01/2023 11:06 PM EDT  RP Workstation: [id number]    Labs: BNP (last 3 results) No results for input(s): "BNP" in the last 8760 hours. Basic Metabolic Panel: Recent Labs  Lab 07/01/23 2008 07/02/23 0418  NA 140 138  K 4.2 3.5  CL 103 102  CO2 27 28  GLUCOSE 119* 87  BUN 22 19  CREATININE 1.07 0.85  CALCIUM  9.4 8.5*  MG  --  1.6*  PHOS  --  3.9   Liver Function Tests: Recent Labs  Lab 07/02/23 0418  AST 17  ALT 16  ALKPHOS 48  BILITOT 0.3  PROT 5.9*  ALBUMIN 3.1*   Recent Labs  Lab 07/01/23 2008 07/02/23 0418  WBC 7.6 6.9  HGB 14.5 12.3*  HCT 45.3 37.8*  MCV 97.0 95.5  PLT 291 223  CBG: Recent Labs  Lab 07/02/23 0443  07/02/23 0553 07/02/23 0755 07/02/23 1226  GLUCAP 81 94 96 124*  Anemia work up No results for input(s): "VITAMINB12", "FOLATE", "FERRITIN", "TIBC", "IRON", "RETICCTPCT" in the last 72 hours. Urinalysis    Component Value Date/Time   COLORURINE AMBER (A) 07/01/2023 2043   APPEARANCEUR TURBID (A) 07/01/2023 2043   LABSPEC 1.026 07/01/2023 2043   PHURINE 5.0 07/01/2023 2043   GLUCOSEU NEGATIVE 07/01/2023 2043   HGBUR LARGE (A) 07/01/2023 2043   BILIRUBINUR NEGATIVE 07/01/2023 2043   KETONESUR NEGATIVE 07/01/2023 2043   PROTEINUR 100 (A) 07/01/2023 2043   UROBILINOGEN 0.2 01/03/2009 0920   NITRITE NEGATIVE 07/01/2023 2043   LEUKOCYTESUR NEGATIVE 07/01/2023 2043   Sepsis Labs Recent Labs  Lab 07/01/23 2008 07/02/23 0418  WBC 7.6 6.9   Microbiology No results found for this or any previous visit (from the past 240 hours).  Time coordinating discharge: 25 minutes  SIGNED: Lesa Rape, MD  [facility] 07/02/2023, 12:59 PM  If 7PM-7AM, please contact night-coverage [email or web address]

## 2023-07-02 NOTE — Care Management CC44 (Signed)
 Condition Code 44 Documentation Completed  Patient Details  Name: Derek Francis MRN: [id number] Date of Birth: 1960/06/11   Condition Code 44 given:  Yes Patient signature on Condition Code 44 notice:  Yes Documentation of 2 MD's agreement:  Yes Code 44 added to claim:  Yes    Cyndie Dredge, LCSWA 07/02/2023, 1:09 PM

## 2023-07-02 NOTE — TOC CM/SW Note (Signed)
 Transition of Care [facility]) - Inpatient Brief Assessment   Patient Details  Name: Derek Francis MRN: [id number] Date of Birth: [birth year]  Transition of Care [facility]) CM/SW Contact:    Cyndie Dredge, LCSWA Phone Number: 07/02/2023, 8:05 AM   Clinical Narrative:  Transition of Care Department [facility]) has reviewed patient and no TOC needs have been identified at this time. We will continue to monitor patient advancement through interdisciplinary progression rounds. If new patient transition needs arise, please place a TOC consult.   Transition of Care Asessment: Insurance and Status: Insurance coverage has been reviewed Patient has primary care physician: Yes Home environment has been reviewed: Single Family Home Prior level of function:: Independent Prior/Current Home Services: No current home services Social Drivers of Health Review: SDOH reviewed no interventions necessary Readmission risk has been reviewed: Yes Transition of care needs: no transition of care needs at this time

## 2023-07-02 NOTE — Consult Note (Signed)
 Urology Consult  Referring physician: Dr Lesa Rape Reason for referral: left ureteral calculi  Chief Complaint: left flank pain  History of Present Illness: Mr Favinger is a 63yo with a history of nephrolithiasis who presented to the ER last night with a 2 day history of worsening left flank pain. He has had numerous stones in the past and occasionally has required ESWL and ureteroscopy. His left flank pain is sharp, intermittent, mild to moderate and nonraditing. He denies nay fevers. He denies any nausea or vomiting. He denies any significant LUTS. Ct shows a 2mm left proximal ureteral calculus with no hydronephrosis. UA shows WBCs, RBCS, and bacteria. WBC count 5.6  Past Medical History:  Diagnosis Date   Abnormal stress test 12/22/2021   Benign neoplasm of sigmoid colon 09/06/2014   Formatting of this note might be different from the original. Advanced adenoma with 1 year recall 03/2013 Normal 09/2014   Repeat colonoscopy 08/2017 ([facility])   Chest pain 01/27/2017   Formatting of this note might be different from the original. - cath 09/04/2020 normal coros. LV 55, left dom. Rt radial access   Chronic pain 09/04/2020   Class 2 severe obesity due to excess calories with serious comorbidity and body mass index (BMI) of 38.0 to 38.9 in adult [facility]) 01/27/2017   Elevated troponin 09/04/2020   Formatting of this note might be different from the original. - cath 2020 1. Left Main - Normal 2. Left anterior descending artery - There is a 20% mid LAD stenosis.  The distal LAD is diffusely small.  There are 2 diagonal arteries which a re moderate sized and large 3. Left Circumflex - Dominant large normal.  There are 2 OM arteries which are normal 4. 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 Allergies:  Allergies  Allergen Reactions   Reglan [Metoclopramide] Anxiety   Toradol  [Ketorolac  Tromethamine ] Anxiety    Family History  Problem Relation Age of Onset   COPD Mother    Diabetes Mother    Dementia Mother    Cancer Father    Kidney disease Father    Hypertension Brother    Social History:  reports that he quit smoking about 14 years ago. His smoking use included cigarettes. He started smoking about 46 years ago. He has been exposed to tobacco smoke. He has never used smokeless tobacco. He reports that he does not drink alcohol and does not use drugs.  Review of Systems  Genitourinary:  Positive for flank pain.  All other systems reviewed and are negative.   Physical Exam:  Vital signs in last 24 hours: Temp:  [97.3 F (36.3 C)-98.6 F (37 C)] 97.6 F (36.4 C) (05/01 0424) Pulse Rate:  [59-87] 65 (05/01 1130) Resp:  [18] 18 (05/01 0424) BP: (101-122)/(64-85) 104/71 (05/01 1130) SpO2:  [91 %-100 %] 91 % (05/01 1130) Weight:  [114.3 kg] 114.3 kg (04/30 1934) Physical Exam Vitals reviewed.  Constitutional:      Appearance: Normal appearance.  HENT:     Head: Normocephalic  and atraumatic.     Nose: Nose normal. No congestion.     Mouth/Throat:     Mouth: Mucous membranes are moist.  Eyes:     Extraocular Movements: Extraocular movements intact.     Pupils: Pupils are equal, round, and reactive to light.  Cardiovascular:     Rate and Rhythm: Normal rate and regular rhythm.  Pulmonary:     Effort: Pulmonary effort is normal. No respiratory distress.  Abdominal:     General: Abdomen is flat. There is no distension.  Musculoskeletal:        General: No swelling. Normal range of motion.     Cervical back: Normal range of motion and neck supple.  Skin:    General: Skin is warm and dry.  Neurological:     General: No focal deficit present.     Mental Status: He is alert and oriented to person, place, and time.  Psychiatric:        Mood and Affect: Mood normal.        Behavior: Behavior normal.        Thought Content: Thought content normal.        Judgment: Judgment normal.     Laboratory Data:  Results for orders placed or performed during the hospital encounter of 07/01/23 (from the past 72 hours)  Basic metabolic panel     Status: Abnormal   Collection Time: 07/01/23  8:08 PM  Result Value Ref Range   Sodium 140 135 - 145 mmol/L   Potassium 4.2 3.5 - 5.1 mmol/L   Chloride 103 98 - 111 mmol/L   CO2 27 22 - 32 mmol/L   Glucose, Bld 119 (H) 70 - 99 mg/dL    Comment: Glucose reference range applies only to samples taken after fasting for at least 8 hours.   BUN 22 8 - 23 mg/dL   Creatinine, Ser 7.62 0.61 - 1.24 mg/dL   Calcium  9.4 8.9 - 10.3 mg/dL   GFR, Estimated >83 >15 mL/min    Comment: (NOTE) Calculated using the CKD-EPI Creatinine Equation (2021)    Anion gap 10 5 - 15    Comment: Performed at [facility], [address] [place], [place] [address]  CBC     Status: None   Collection Time: 07/01/23  8:08 PM  Result Value Ref Range   WBC 7.6 4.0 - 10.5 K/uL   RBC 4.67 4.22 - 5.81 MIL/uL   Hemoglobin 14.5 13.0 - 17.0 g/dL   HCT 07.3  71.0 - 62.6 %   MCV 97.0 80.0 - 100.0 fL   MCH 31.0 26.0 - 34.0 pg   MCHC 32.0 30.0 - 36.0 g/dL   RDW 94.8 54.6 - 27.0 %   Platelets 291 150 - 400 K/uL   nRBC 0.0 0.0 - 0.2 %    Comment: Performed at [facility], [address], [place], [place] [address]  Urinalysis, Routine w reflex microscopic -Urine, Clean Catch  Status: Abnormal   Collection Time: 07/01/23  8:43 PM  Result Value Ref Range   Color, Urine AMBER (A) YELLOW    Comment: BIOCHEMICALS MAY BE AFFECTED BY COLOR   APPearance TURBID (A) CLEAR   Specific Gravity, Urine 1.026 1.005 - 1.030   pH 5.0 5.0 - 8.0   Glucose, UA NEGATIVE NEGATIVE mg/dL   Hgb urine dipstick LARGE (A) NEGATIVE   Bilirubin Urine NEGATIVE NEGATIVE   Ketones, ur NEGATIVE NEGATIVE mg/dL   Protein, ur 161 (A) NEGATIVE mg/dL   Nitrite NEGATIVE NEGATIVE   Leukocytes,Ua NEGATIVE NEGATIVE   RBC / HPF 21-50 0 - 5 RBC/hpf   WBC, UA 11-20 0 - 5 WBC/hpf   Bacteria, UA MANY (A) NONE SEEN   Squamous Epithelial / HPF 0-5 0 - 5 /HPF   Mucus PRESENT    Amorphous Crystal PRESENT     Comment: Performed at [facility], [address], [place], [place] [address]  HIV Antibody (routine testing w rflx)     Status: None   Collection Time: 07/02/23  4:18 AM  Result Value Ref Range   HIV Screen 4th Generation wRfx Non Reactive Non Reactive    Comment: Performed at [facility], [address], [place], [place] [address]  Comprehensive metabolic panel     Status: Abnormal   Collection Time: 07/02/23  4:18 AM  Result Value Ref Range   Sodium 138 135 - 145 mmol/L   Potassium 3.5 3.5 - 5.1 mmol/L   Chloride 102 98 - 111 mmol/L   CO2 28 22 - 32 mmol/L   Glucose, Bld 87 70 - 99 mg/dL    Comment: Glucose reference range applies only to samples taken after fasting for at least 8 hours.   BUN 19 8 - 23 mg/dL   Creatinine, Ser 1.19 0.61 - 1.24 mg/dL   Calcium  8.5 (L) 8.9 - 10.3 mg/dL   Total Protein 5.9 (L) 6.5 - 8.1 g/dL   Albumin 3.1 (L) 3.5 - 5.0 g/dL   AST  17 15 - 41 U/L   ALT 16 0 - 44 U/L   Alkaline Phosphatase 48 38 - 126 U/L   Total Bilirubin 0.3 0.0 - 1.2 mg/dL   GFR, Estimated >14 >78 mL/min    Comment: (NOTE) Calculated using the CKD-EPI Creatinine Equation (2021)    Anion gap 8 5 - 15    Comment: Performed at [facility], [address] [place], [place] [address]  CBC     Status: Abnormal   Collection Time: 07/02/23  4:18 AM  Result Value Ref Range   WBC 6.9 4.0 - 10.5 K/uL   RBC 3.96 (L) 4.22 - 5.81 MIL/uL   Hemoglobin 12.3 (L) 13.0 - 17.0 g/dL   HCT 13.0 (L) 86.5 - 78.4 %   MCV 95.5 80.0 - 100.0 fL   MCH 31.1 26.0 - 34.0 pg   MCHC 32.5 30.0 - 36.0 g/dL   RDW 69.6 29.5 - 28.4 %   Platelets 223 150 - 400 K/uL   nRBC 0.0 0.0 - 0.2 %    Comment: Performed at [facility], [address], [place], [place] [address]  Magnesium     Status: Abnormal   Collection Time: 07/02/23  4:18 AM  Result Value Ref Range   Magnesium 1.6 (L) 1.7 - 2.4 mg/dL    Comment: Performed at [facility], [address] [place], [place] [address]  Phosphorus     Status: None   Collection Time:  07/02/23  4:18 AM  Result Value Ref Range   Phosphorus 3.9 2.5 - 4.6 mg/dL    Comment: Performed at [facility], [address] [place], [place] [address]  Hemoglobin A1c     Status: Abnormal   Collection Time: 07/02/23  4:18 AM  Result Value Ref Range   Hgb A1c MFr Bld 5.9 (H) 4.8 - 5.6 %    Comment: (NOTE) Pre diabetes:          5.7%-6.4%  Diabetes:              >6.4%  Glycemic control for   <7.0% adults with diabetes    Mean Plasma Glucose 122.63 mg/dL    Comment: Performed at [facility], [address] [place], [place] [address]  CBG monitoring, ED     Status: None   Collection Time: 07/02/23  4:43 AM  Result Value Ref Range   Glucose-Capillary 81 70 - 99 mg/dL    Comment: Glucose reference range applies only to samples taken after fasting for at least 8 hours.  CBG monitoring, ED     Status: None   Collection Time: 07/02/23  5:53  AM  Result Value Ref Range   Glucose-Capillary 94 70 - 99 mg/dL    Comment: Glucose reference range applies only to samples taken after fasting for at least 8 hours.  CBG monitoring, ED     Status: None   Collection Time: 07/02/23  7:55 AM  Result Value Ref Range   Glucose-Capillary 96 70 - 99 mg/dL    Comment: Glucose reference range applies only to samples taken after fasting for at least 8 hours.   Comment 1 Notify RN    Comment 2 Document in Chart   CBG monitoring, ED     Status: Abnormal   Collection Time: 07/02/23 12:26 PM  Result Value Ref Range   Glucose-Capillary 124 (H) 70 - 99 mg/dL    Comment: Glucose reference range applies only to samples taken after fasting for at least 8 hours.   Comment 1 Notify RN    Comment 2 Document in Chart    No results found for this or any previous visit (from the past 240 hours). Creatinine: Recent Labs    07/01/23 2008 07/02/23 0418  CREATININE 1.07 0.85   Baseline Creatinine: 0.8  Impression/Assessment:  63yo with left ureteral calculus, UTI  Plan:  Ureteral calculus: -We discussed the management of kidney stones. These options include observation, ureteroscopy, shockwave lithotripsy (ESWL) and percutaneous nephrolithotomy (PCNL). We discussed which options are relevant to the patient's stone(s). We discussed the natural history of kidney stones as well as the complications of untreated stones and the impact on quality of life without treatment as well as with each of the above listed treatments. We also discussed the efficacy of each treatment in its ability to clear the stone burden. With any of these management options I discussed the signs and symptoms of infection and the need for emergent treatment should these be experienced. For each option we discussed the ability of each procedure to clear the patient of their stone burden.   For observation I described the risks which include but are not limited to silent renal damage,  life-threatening infection, need for emergent surgery, failure to pass stone and pain.   For ureteroscopy I described the risks which include bleeding, infection, damage to contiguous structures, positioning injury, ureteral stricture, ureteral avulsion, ureteral injury, need for prolonged ureteral stent, inability to perform ureteroscopy,  need for an interval procedure, inability to clear stone burden, stent discomfort/pain, heart attack, stroke, pulmonary embolus and the inherent risks with general anesthesia.   For shockwave lithotripsy I described the risks which include arrhythmia, kidney contusion, kidney hemorrhage, need for transfusion, pain, inability to adequately break up stone, inability to pass stone fragments, Steinstrasse, infection associated with obstructing stones, need for alternate surgical procedure, need for repeat shockwave lithotripsy, MI, CVA, PE and the inherent risks with anesthesia/conscious sedation.   For PCNL I described the risks including positioning injury, pneumothorax, hydrothorax, need for chest tube, inability to clear stone burden, renal laceration, arterial venous fistula or malformation, need for embolization of kidney, loss of kidney or renal function, need for repeat procedure, need for prolonged nephrostomy tube, ureteral avulsion, MI, CVA, PE and the inherent risks of general anesthesia.   - The patient would like to proceed with medical expulsive therapy. We will start flomax  0.4mg  daily. He will be discharged with pain medication and zofran  UTI: Urine was sent for culture and patient was given dose of rocephin . He can be discharged home with vantin  200mg  BID for 7 days  Johnie Nailer 07/02/2023, 12:46 PM

## 2023-07-02 NOTE — Care Management Obs Status (Signed)
 MEDICARE OBSERVATION STATUS NOTIFICATION   Patient Details  Name: Derek Francis MRN: [id number] Date of Birth: [birth year]   Medicare Observation Status Notification Given:  Yes    Cyndie Dredge, LCSWA 07/02/2023, 1:09 PM

## 2023-07-03 LAB — URINE CULTURE: Culture: NO GROWTH

## 2023-07-08 ENCOUNTER — Other Ambulatory Visit (HOSPITAL_BASED_OUTPATIENT_CLINIC_OR_DEPARTMENT_OTHER): Payer: Self-pay

## 2023-07-08 MED ORDER — OXYCODONE HCL 15 MG PO TABS
15.0000 mg | ORAL_TABLET | ORAL | 0 refills | Status: AC
  Filled 2023-07-24: qty 180, 30d supply, fill #0

## 2023-07-08 MED ORDER — OXYCODONE HCL 15 MG PO TABS
15.0000 mg | ORAL_TABLET | ORAL | 0 refills | Status: AC | PRN
  Filled 2023-08-22: qty 180, 30d supply, fill #0

## 2023-07-15 ENCOUNTER — Other Ambulatory Visit: Payer: Self-pay

## 2023-07-15 DIAGNOSIS — N200 Calculus of kidney: Secondary | ICD-10-CM

## 2023-07-23 ENCOUNTER — Other Ambulatory Visit (HOSPITAL_BASED_OUTPATIENT_CLINIC_OR_DEPARTMENT_OTHER): Payer: Self-pay

## 2023-07-24 ENCOUNTER — Other Ambulatory Visit (HOSPITAL_BASED_OUTPATIENT_CLINIC_OR_DEPARTMENT_OTHER): Payer: Self-pay

## 2023-07-24 ENCOUNTER — Other Ambulatory Visit: Payer: Self-pay

## 2023-08-19 ENCOUNTER — Ambulatory Visit: Payer: Medicare (Managed Care) | Admitting: Urology

## 2023-08-21 ENCOUNTER — Other Ambulatory Visit (HOSPITAL_BASED_OUTPATIENT_CLINIC_OR_DEPARTMENT_OTHER): Payer: Self-pay

## 2023-08-22 ENCOUNTER — Other Ambulatory Visit (HOSPITAL_BASED_OUTPATIENT_CLINIC_OR_DEPARTMENT_OTHER): Payer: Self-pay

## 2023-08-26 ENCOUNTER — Other Ambulatory Visit (HOSPITAL_BASED_OUTPATIENT_CLINIC_OR_DEPARTMENT_OTHER): Payer: Self-pay

## 2023-08-26 MED ORDER — OXYCODONE HCL 15 MG PO TABS
ORAL_TABLET | ORAL | 0 refills | Status: AC
  Filled 2023-09-19: qty 180, 30d supply, fill #0

## 2023-08-26 MED ORDER — OXYCODONE HCL 15 MG PO TABS
ORAL_TABLET | ORAL | 0 refills | Status: AC
  Filled 2023-10-19: qty 180, 30d supply, fill #0

## 2023-09-19 ENCOUNTER — Other Ambulatory Visit (HOSPITAL_BASED_OUTPATIENT_CLINIC_OR_DEPARTMENT_OTHER): Payer: Self-pay

## 2023-10-14 ENCOUNTER — Other Ambulatory Visit (HOSPITAL_BASED_OUTPATIENT_CLINIC_OR_DEPARTMENT_OTHER): Payer: Self-pay

## 2023-10-14 MED ORDER — OXYCODONE HCL 15 MG PO TABS
15.0000 mg | ORAL_TABLET | ORAL | 0 refills | Status: AC
  Filled 2023-12-14: qty 180, 30d supply, fill #0

## 2023-10-14 MED ORDER — OXYCODONE HCL 15 MG PO TABS
15.0000 mg | ORAL_TABLET | ORAL | 0 refills | Status: AC
  Filled 2023-11-16: qty 180, 30d supply, fill #0

## 2023-10-19 ENCOUNTER — Other Ambulatory Visit (HOSPITAL_BASED_OUTPATIENT_CLINIC_OR_DEPARTMENT_OTHER): Payer: Self-pay

## 2023-11-13 ENCOUNTER — Other Ambulatory Visit: Payer: Self-pay

## 2023-11-16 ENCOUNTER — Other Ambulatory Visit (HOSPITAL_BASED_OUTPATIENT_CLINIC_OR_DEPARTMENT_OTHER): Payer: Self-pay

## 2023-12-02 ENCOUNTER — Other Ambulatory Visit (HOSPITAL_BASED_OUTPATIENT_CLINIC_OR_DEPARTMENT_OTHER): Payer: Self-pay

## 2023-12-02 MED ORDER — OXYCODONE HCL 15 MG PO TABS
15.0000 mg | ORAL_TABLET | ORAL | 0 refills | Status: AC
  Filled 2024-01-12: qty 180, 30d supply, fill #0

## 2023-12-04 ENCOUNTER — Other Ambulatory Visit (HOSPITAL_COMMUNITY): Payer: Self-pay

## 2023-12-14 ENCOUNTER — Other Ambulatory Visit (HOSPITAL_BASED_OUTPATIENT_CLINIC_OR_DEPARTMENT_OTHER): Payer: Self-pay

## 2024-01-12 ENCOUNTER — Other Ambulatory Visit (HOSPITAL_BASED_OUTPATIENT_CLINIC_OR_DEPARTMENT_OTHER): Payer: Self-pay

## 2024-01-19 ENCOUNTER — Other Ambulatory Visit: Payer: Self-pay

## 2024-01-21 ENCOUNTER — Other Ambulatory Visit (HOSPITAL_BASED_OUTPATIENT_CLINIC_OR_DEPARTMENT_OTHER): Payer: Self-pay

## 2024-01-21 MED ORDER — OXYCODONE HCL 15 MG PO TABS
15.0000 mg | ORAL_TABLET | ORAL | 0 refills | Status: AC
  Filled 2024-03-09: qty 180, 30d supply, fill #0

## 2024-01-21 MED ORDER — OXYCODONE HCL 15 MG PO TABS
15.0000 mg | ORAL_TABLET | ORAL | 0 refills | Status: AC
  Filled 2024-02-09: qty 180, 30d supply, fill #0

## 2024-02-08 ENCOUNTER — Other Ambulatory Visit (HOSPITAL_BASED_OUTPATIENT_CLINIC_OR_DEPARTMENT_OTHER): Payer: Self-pay

## 2024-02-09 ENCOUNTER — Other Ambulatory Visit (HOSPITAL_BASED_OUTPATIENT_CLINIC_OR_DEPARTMENT_OTHER): Payer: Self-pay

## 2024-02-24 ENCOUNTER — Other Ambulatory Visit (HOSPITAL_BASED_OUTPATIENT_CLINIC_OR_DEPARTMENT_OTHER): Payer: Self-pay

## 2024-03-08 ENCOUNTER — Other Ambulatory Visit (HOSPITAL_BASED_OUTPATIENT_CLINIC_OR_DEPARTMENT_OTHER): Payer: Self-pay

## 2024-03-09 ENCOUNTER — Other Ambulatory Visit (HOSPITAL_BASED_OUTPATIENT_CLINIC_OR_DEPARTMENT_OTHER): Payer: Self-pay

## 2024-03-23 ENCOUNTER — Other Ambulatory Visit (HOSPITAL_BASED_OUTPATIENT_CLINIC_OR_DEPARTMENT_OTHER): Payer: Self-pay

## 2024-03-23 MED ORDER — OXYCODONE HCL 15 MG PO TABS
15.0000 mg | ORAL_TABLET | ORAL | 0 refills | Status: AC
  Filled 2024-04-07: qty 180, 30d supply, fill #0

## 2024-03-23 MED ORDER — METHYLPREDNISOLONE 4 MG PO TBPK
ORAL_TABLET | ORAL | 0 refills | Status: AC
  Filled 2024-03-23: qty 21, 6d supply, fill #0

## 2024-03-23 MED ORDER — OXYCODONE HCL 15 MG PO TABS: 15.0000 mg | ORAL_TABLET | ORAL | 0 refills | Status: AC

## 2024-03-24 ENCOUNTER — Other Ambulatory Visit (HOSPITAL_BASED_OUTPATIENT_CLINIC_OR_DEPARTMENT_OTHER): Payer: Self-pay

## 2024-04-06 ENCOUNTER — Other Ambulatory Visit (HOSPITAL_BASED_OUTPATIENT_CLINIC_OR_DEPARTMENT_OTHER): Payer: Self-pay

## 2024-04-07 ENCOUNTER — Other Ambulatory Visit (HOSPITAL_BASED_OUTPATIENT_CLINIC_OR_DEPARTMENT_OTHER): Payer: Self-pay
